# Patient Record
Sex: Female | Born: 1945 | Race: White | Hispanic: No | Marital: Married | Smoking: Never smoker
Health system: Southern US, Community
[De-identification: ages and names within clinical notes are randomized; demographics above are authoritative.]

## PROBLEM LIST (undated history)

## (undated) DIAGNOSIS — L6612 Frontal fibrosing alopecia: Secondary | ICD-10-CM

## (undated) DIAGNOSIS — Q615 Medullary cystic kidney: Secondary | ICD-10-CM

## (undated) DIAGNOSIS — L661 Lichen planopilaris: Secondary | ICD-10-CM

## (undated) HISTORY — PX: BREAST EXCISIONAL BIOPSY: SUR124

---

## 1999-02-17 ENCOUNTER — Other Ambulatory Visit: Admission: RE | Admit: 1999-02-17 | Discharge: 1999-02-17 | Payer: Self-pay | Admitting: *Deleted

## 1999-07-20 ENCOUNTER — Encounter: Payer: Self-pay | Admitting: *Deleted

## 1999-07-20 ENCOUNTER — Encounter: Admission: RE | Admit: 1999-07-20 | Discharge: 1999-07-20 | Payer: Self-pay | Admitting: *Deleted

## 2000-02-15 ENCOUNTER — Other Ambulatory Visit: Admission: RE | Admit: 2000-02-15 | Discharge: 2000-02-15 | Payer: Self-pay | Admitting: *Deleted

## 2000-03-12 ENCOUNTER — Other Ambulatory Visit: Admission: RE | Admit: 2000-03-12 | Discharge: 2000-03-12 | Payer: Self-pay | Admitting: *Deleted

## 2000-08-07 ENCOUNTER — Encounter: Admission: RE | Admit: 2000-08-07 | Discharge: 2000-08-07 | Payer: Self-pay | Admitting: *Deleted

## 2000-08-07 ENCOUNTER — Encounter: Payer: Self-pay | Admitting: *Deleted

## 2000-08-14 ENCOUNTER — Encounter: Payer: Self-pay | Admitting: *Deleted

## 2000-08-14 ENCOUNTER — Encounter: Admission: RE | Admit: 2000-08-14 | Discharge: 2000-08-14 | Payer: Self-pay | Admitting: *Deleted

## 2000-09-11 DIAGNOSIS — C4492 Squamous cell carcinoma of skin, unspecified: Secondary | ICD-10-CM

## 2000-09-11 HISTORY — DX: Squamous cell carcinoma of skin, unspecified: C44.92

## 2001-02-15 ENCOUNTER — Other Ambulatory Visit: Admission: RE | Admit: 2001-02-15 | Discharge: 2001-02-15 | Payer: Self-pay | Admitting: *Deleted

## 2001-08-07 ENCOUNTER — Other Ambulatory Visit: Admission: RE | Admit: 2001-08-07 | Discharge: 2001-08-07 | Payer: Self-pay | Admitting: *Deleted

## 2001-08-22 ENCOUNTER — Encounter: Payer: Self-pay | Admitting: *Deleted

## 2001-08-22 ENCOUNTER — Encounter: Admission: RE | Admit: 2001-08-22 | Discharge: 2001-08-22 | Payer: Self-pay | Admitting: *Deleted

## 2002-08-27 ENCOUNTER — Encounter: Payer: Self-pay | Admitting: *Deleted

## 2002-08-27 ENCOUNTER — Encounter: Admission: RE | Admit: 2002-08-27 | Discharge: 2002-08-27 | Payer: Self-pay | Admitting: *Deleted

## 2002-09-23 ENCOUNTER — Other Ambulatory Visit: Admission: RE | Admit: 2002-09-23 | Discharge: 2002-09-23 | Payer: Self-pay | Admitting: *Deleted

## 2003-09-17 ENCOUNTER — Encounter: Admission: RE | Admit: 2003-09-17 | Discharge: 2003-09-17 | Payer: Self-pay | Admitting: *Deleted

## 2003-09-30 ENCOUNTER — Other Ambulatory Visit: Admission: RE | Admit: 2003-09-30 | Discharge: 2003-09-30 | Payer: Self-pay | Admitting: *Deleted

## 2020-10-05 ENCOUNTER — Encounter: Payer: Self-pay | Admitting: Dermatology

## 2020-10-05 ENCOUNTER — Other Ambulatory Visit: Payer: Self-pay

## 2020-10-05 ENCOUNTER — Ambulatory Visit: Payer: Medicare PPO | Admitting: Dermatology

## 2020-10-05 DIAGNOSIS — L661 Lichen planopilaris: Secondary | ICD-10-CM

## 2020-10-05 DIAGNOSIS — D1800 Hemangioma unspecified site: Secondary | ICD-10-CM

## 2020-10-05 DIAGNOSIS — D489 Neoplasm of uncertain behavior, unspecified: Secondary | ICD-10-CM

## 2020-10-05 DIAGNOSIS — D485 Neoplasm of uncertain behavior of skin: Secondary | ICD-10-CM | POA: Diagnosis not present

## 2020-10-05 MED ORDER — TACROLIMUS 0.1 % EX OINT: TOPICAL_OINTMENT | CUTANEOUS | 1 refills | Status: DC

## 2020-10-05 NOTE — Progress Notes (Signed)
   New Patient Visit  Subjective  Nichole Sloan is a 75 y.o. female who presents for the following: Skin Problem (Patient presents today with a new mole on the side of her left breast, noticed 1 month ago. No symptoms. She also has an area on her left ear that gets inflamed and red every few months. She had a surgery 10+ years ago to remove part of her ear. She sleeps on her left side. 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 She uses clobetasol solution about 3 times a week. She has noticed some worsening recently.).  The following portions of the chart were reviewed this encounter and updated as appropriate:       Review of Systems:  No other skin or systemic complaints except as noted in HPI or Assessment and Plan.  Objective  Well appearing patient in no apparent distress; mood and affect are within normal limits.  A focused examination was performed including face, scalp, breast. Relevant physical exam findings are noted in the Assessment and Plan.  Objective  Left Antihelix: Pink nodule with central erosion, 1.0cm      Objective  bil temporal and frontal hairline: Thinning of the hair temporal scalp bil with alopecia and loss of follicles, receding frontal hairline with perifollicular prominence, and lonely hairs, prominent temporal veins BL, absent eyebrows  Images         Assessment & Plan   Hemangiomas - Red papules, including left breast - Discussed benign nature - Observe - Call for any changes   Neoplasm of uncertain behavior Left Antihelix  Skin / nail biopsy Type of biopsy: tangential   Informed consent: discussed and consent obtained   Patient was prepped and draped in usual sterile fashion: Area prepped with alcohol. Anesthesia: the lesion was anesthetized in a standard fashion   Anesthetic:  1% lidocaine w/ epinephrine 1-100,000 buffered w/ 8.4% NaHCO3 Instrument used: flexible  razor blade   Hemostasis achieved with: pressure, aluminum chloride and electrodesiccation   Outcome: patient tolerated procedure well   Post-procedure details: wound care instructions given   Post-procedure details comment:  Ointment and small bandage applied  Specimen 1 - Surgical pathology Differential Diagnosis: Chondrodermatitis vs Hypertrophic AK/SCC Check Margins: No Pink nodule with central erosion, 1.0cm  Recommend decrease pressure to left ear. Continue using donut pillow while sleeping.    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  No cure.  Goal is to prevent progression.  Start Protopic 0.1% ointment Apply to AAs qhs to hairline/forehead. Continue clobetasol solution qhs prn. Avoid face, groin, axilla. (pt has refills from previous dermatologist)  Discussed [place] steroid injections, Plaquenil (would require yearly eye exams).  Patient had exam in May 2021 in [place].  She will schedule baseline eye exam to start Plaquenil. Pending labs, will start Plaquenil.   tacrolimus (PROTOPIC) 0.1 % ointment - bil temporal and frontal hairline  Other Related Procedures CBC with Differential/Platelets Comprehensive metabolic panel  Return in about 2 months (around 12/03/2020) for f/u hairloss and TBSE.  IJamesetta Orleans, CMA, am acting as scribe for Brendolyn Patty, MD .  Documentation: I have reviewed the above documentation for accuracy and completeness, and I agree with the above.  Brendolyn Patty MD

## 2020-10-05 NOTE — Patient Instructions (Addendum)
Schedule baseline eye exam to start Plaquenil treatment. [facility]. Please have them send office note to Dr Nicole Kindred, fax [phone].    Wound Care Instructions  1. Cleanse wound gently with soap and water once a day then pat dry with clean gauze. Apply a thing coat of Petrolatum (petroleum jelly, "Vaseline") over the wound (unless you have an allergy to this). We recommend that you use a new, sterile tube of Vaseline. Do not pick or remove scabs. Do not remove the yellow or white "healing tissue" from the base of the wound.  2. Cover the wound with fresh, clean, nonstick gauze and secure with paper tape. You may use Band-Aids in place of gauze and tape if the would is small enough, but would recommend trimming much of the tape off as there is often too much. Sometimes Band-Aids can irritate the skin.  3. You should call the office for your biopsy report after 1 week if you have not already been contacted.  4. If you experience any problems, such as abnormal amounts of bleeding, swelling, significant bruising, significant pain, or evidence of infection, please call the office immediately.    Protopic ointment Apply to affected area of scalp at night. Continue clobetasol solution to affected areas scalp. Avoid face.

## 2020-10-07 LAB — COMPREHENSIVE METABOLIC PANEL
ALT: 17 IU/L (ref 0–32)
AST: 20 IU/L (ref 0–40)
Albumin/Globulin Ratio: 1.8 (ref 1.2–2.2)
Albumin: 4.2 g/dL (ref 3.7–4.7)
Alkaline Phosphatase: 99 IU/L (ref 44–121)
BUN/Creatinine Ratio: 23 (ref 12–28)
BUN: 19 mg/dL (ref 8–27)
Bilirubin Total: <0.2 mg/dL (ref 0.0–1.2)
CO2: 24 mmol/L (ref 20–29)
Calcium: 9.9 mg/dL (ref 8.7–10.3)
Chloride: 102 mmol/L (ref 96–106)
Creatinine, Ser: 0.83 mg/dL (ref 0.57–1.00)
GFR calc Af Amer: 81 mL/min/{1.73_m2} (ref >59)
GFR calc non Af Amer: 70 mL/min/{1.73_m2} (ref >59)
Globulin, Total: 2.3 g/dL (ref 1.5–4.5)
Glucose: 87 mg/dL (ref 65–99)
Potassium: 3.9 mmol/L (ref 3.5–5.2)
Sodium: 142 mmol/L (ref 134–144)
Total Protein: 6.5 g/dL (ref 6.0–8.5)

## 2020-10-07 LAB — CBC WITH DIFFERENTIAL/PLATELET
Basophils Absolute: 0.1 10*3/uL (ref 0.0–0.2)
Basos: 1 %
EOS (ABSOLUTE): 0.1 10*3/uL (ref 0.0–0.4)
Eos: 1 %
Hematocrit: 39.5 % (ref 34.0–46.6)
Hemoglobin: 13.1 g/dL (ref 11.1–15.9)
Immature Grans (Abs): 0 10*3/uL (ref 0.0–0.1)
Immature Granulocytes: 0 %
Lymphocytes Absolute: 2.7 10*3/uL (ref 0.7–3.1)
Lymphs: 37 %
MCH: 29.9 pg (ref 26.6–33.0)
MCHC: 33.2 g/dL (ref 31.5–35.7)
MCV: 90 fL (ref 79–97)
Monocytes Absolute: 0.5 10*3/uL (ref 0.1–0.9)
Monocytes: 6 %
Neutrophils Absolute: 4 10*3/uL (ref 1.4–7.0)
Neutrophils: 55 %
Platelets: 291 10*3/uL (ref 150–450)
RBC: 4.38 x10E6/uL (ref 3.77–5.28)
RDW: 12.5 % (ref 11.7–15.4)
WBC: 7.3 10*3/uL (ref 3.4–10.8)

## 2020-10-11 ENCOUNTER — Telehealth: Payer: Self-pay

## 2020-10-11 NOTE — Telephone Encounter (Signed)
-----   Message from Brendolyn Patty, MD sent at 10/11/2020  8:45 AM EST ----- Skin , left antihelix CHONDRODERMATITIS NODULARIS HELICIS  Benign inflammation of the cartilage likely due to pressure on ear when sleeping.  May recur.

## 2020-10-11 NOTE — Telephone Encounter (Signed)
Advised pt of lab results.  Advised pt once we get her baseline eye exam we can send in the Plaquenil 200./sh

## 2020-10-11 NOTE — Telephone Encounter (Signed)
Advised pt of bx results/sh ?

## 2020-10-11 NOTE — Telephone Encounter (Signed)
-----   Message from Brendolyn Patty, MD sent at 10/11/2020  8:47 AM EST ----- Baseline CMP and CBC normal, pending normal baseline eye exam, can start Plaquenil 200 mg PO bid, 3 rfs

## 2020-10-12 ENCOUNTER — Telehealth: Payer: Self-pay

## 2020-10-12 MED ORDER — HYDROXYCHLOROQUINE SULFATE 200 MG PO TABS: 200.0000 mg | ORAL_TABLET | Freq: Two times a day (BID) | ORAL | 3 refills | Status: DC

## 2020-10-12 NOTE — Telephone Encounter (Signed)
Advised pt we received information on pt's eye exam from [facility].  Advised Plaquenil 200mg  1 po bid #[id number] sent to [facility]./sh

## 2020-10-14 ENCOUNTER — Telehealth: Payer: Self-pay

## 2020-10-14 NOTE — Telephone Encounter (Signed)
Pt called she went to her eye doctor to discuss plaquenil  Dr Nicole Kindred prescribed her eye doctor told her due to her weight of 140 lbs she should be taking the lower dose of plaquenil, Plaquenil 200 mg bid may be to high of a dose for her weight.  please let her know what should she be taking

## 2020-10-18 NOTE — Telephone Encounter (Signed)
I would like to start her at the twice daily dosage to see if we can get a good result, and if so, we can decrease the dose later.  Plaquenil is very safe and is not a problem to be on the higher dosage for the short term.  If she stays on it chronically, then I would adjust her dose down to a level that still maintains benefit.

## 2020-10-18 NOTE — Telephone Encounter (Signed)
Patient advised of information per Dr. Nicole Kindred.

## 2020-11-22 ENCOUNTER — Ambulatory Visit: Payer: Self-pay | Admitting: Dermatology

## 2020-12-06 ENCOUNTER — Other Ambulatory Visit: Payer: Self-pay

## 2020-12-06 ENCOUNTER — Ambulatory Visit: Payer: Medicare PPO | Admitting: Dermatology

## 2020-12-06 DIAGNOSIS — D225 Melanocytic nevi of trunk: Secondary | ICD-10-CM | POA: Diagnosis not present

## 2020-12-06 DIAGNOSIS — L821 Other seborrheic keratosis: Secondary | ICD-10-CM

## 2020-12-06 DIAGNOSIS — D229 Melanocytic nevi, unspecified: Secondary | ICD-10-CM

## 2020-12-06 DIAGNOSIS — D2272 Melanocytic nevi of left lower limb, including hip: Secondary | ICD-10-CM

## 2020-12-06 DIAGNOSIS — H61002 Unspecified perichondritis of left external ear: Secondary | ICD-10-CM

## 2020-12-06 DIAGNOSIS — D2239 Melanocytic nevi of other parts of face: Secondary | ICD-10-CM

## 2020-12-06 DIAGNOSIS — L661 Lichen planopilaris: Secondary | ICD-10-CM

## 2020-12-06 DIAGNOSIS — Z1283 Encounter for screening for malignant neoplasm of skin: Secondary | ICD-10-CM

## 2020-12-06 DIAGNOSIS — L578 Other skin changes due to chronic exposure to nonionizing radiation: Secondary | ICD-10-CM

## 2020-12-06 DIAGNOSIS — D1800 Hemangioma unspecified site: Secondary | ICD-10-CM

## 2020-12-06 DIAGNOSIS — L918 Other hypertrophic disorders of the skin: Secondary | ICD-10-CM

## 2020-12-06 DIAGNOSIS — L811 Chloasma: Secondary | ICD-10-CM

## 2020-12-06 MED ORDER — HYDROQUINONE 4 % EX CREA: TOPICAL_CREAM | CUTANEOUS | 1 refills | Status: AC

## 2020-12-06 NOTE — Patient Instructions (Signed)

## 2020-12-06 NOTE — Progress Notes (Signed)
Follow-Up Visit   Subjective  Nichole Sloan is a 75 y.o. female who presents for the following: Follow-up (Patient here for TBSE and follow-up Frontal Fibrosing Alopecia. She is taking Plaquenil 200mg  BID and Protopic Ointment. She hasn't seen much improvement, but scalp is no worse. She also has dark areas on her anterior neck, not itchy or bothersome.).  She started plaquenil couple months ago after getting cleared by eye doctor.  No side effects that she has noticed.  Hair loss has stabilized.   The following portions of the chart were reviewed this encounter and updated as appropriate:       Review of Systems:  No other skin or systemic complaints except as noted in HPI or Assessment and Plan.  Objective  Well appearing patient in no apparent distress; mood and affect are within normal limits.  A full examination was performed including scalp, head, eyes, ears, nose, lips, neck, chest, axillae, abdomen, back, buttocks, bilateral upper extremities, bilateral lower extremities, hands, feet, fingers, toes, fingernails, and toenails. All findings within normal limits unless otherwise noted below.  Objective  Scalp: Thinning of the hair temporal scalp bil with alopecia and loss of follicles, receding frontal hairline with perifollicular prominence, and lonely hairs, prominent temporal veins BL, absent eyebrows  Left temporal hairline has some light hair regrowth when compared to photos  Objective  Right Spinal Lower Back: 2.27mm med dark brown macule  Left Medial Ankle: 2.74mm med dark brown macule   Left Lower Hip: 2.26mm med dark brown macule  Right Preauricular: 2.110mm 2-tone brown macule darker inferior  Images    Objective  Anterior neck: Reticulated hyperpigmentation.  Images    Objective  Left Ear: Pink biopsy site with central crust   Assessment & Plan   Skin cancer screening performed today.  Actinic Damage - chronic, secondary to cumulative UV  radiation exposure/sun exposure over time - diffuse scaly erythematous macules with underlying dyspigmentation - Recommend daily broad spectrum sunscreen SPF 30+ to sun-exposed areas, reapply every 2 hours as needed.  - Recommend staying in the shade or wearing long sleeves, sun glasses (UVA+UVB protection) and wide brim hats (4-inch brim around the entire circumference of the hat). - Call for new or changing lesions.  Acrochordons (Skin Tags) - Fleshy, skin-colored pedunculated papules - Benign appearing.  - Observe. - If desired, they can be removed with an in office procedure that is not covered by insurance. - Please call the clinic if you notice any new or changing lesions.  Seborrheic Keratoses - Stuck-on, waxy, tan-brown papules and/or plaques  - Benign-appearing - Discussed benign etiology and prognosis. - Observe - Call for any changes  Hemangiomas - Red papules - Discussed benign nature - Observe - Call for any changes   Frontal fibrosing alopecia Scalp  Frontal fibrosing alopecia. Stable, with some improvement on the left temporal hairline. Discussed chronic inflammatory condition causing scarring alopecia.  No cure.  Goal is to prevent progression.  Labs ordered today.  Continue Plaquenil 200mg  take 1 po BID. Pt has refills. Continue Protopic 0.1% ointment to hairline qhs Continue clobetasol solution to scalp behind hairline qhs  Hydroxychloroquine (plaquenil) can rarely cause irreversible damage to the retina of the eye if used for a long period of time. This damage can be identified and the medication stopped before vision changes are noticed by going for a yearly ophthalmology exam. Rarely, this medicine can cause low blood counts, liver inflammation, and/or a color change of the skin.  The  use of Plaquenil requires long term medication management, including periodic office visits and monitoring of blood work.   CBC with Differential/Platelet - Scalp  Other  Related Medications tacrolimus (PROTOPIC) 0.1 % ointment  Nevus (4) Left Lower Hip; Left Medial Ankle; Right Spinal Lower Back; Right Preauricular  Benign-appearing.  Observation.  Call clinic for new or changing moles.  Recommend daily use of broad spectrum spf 30+ sunscreen to sun-exposed areas.   Recheck right preauricular on follow-up.  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  Recommend daily broad spectrum sunscreen SPF 30+ to sun-exposed areas, including aa neck, reapply every 2 hours as needed.   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  Benign. May recur  Try to avoid sleeping on left side. Use donut pillow to avoid pressure to the area.  Return in about 3 months (around 03/08/2021) for FFA/LPP, Recheck neck, and mole on R cheek.Lindi Adie, CMA, am acting as scribe for Brendolyn Patty, MD .  Documentation: I have reviewed the above documentation for accuracy and completeness, and I agree with the above.  Brendolyn Patty MD

## 2020-12-15 ENCOUNTER — Telehealth: Payer: Self-pay

## 2020-12-15 ENCOUNTER — Other Ambulatory Visit: Payer: Self-pay

## 2020-12-15 DIAGNOSIS — L661 Lichen planopilaris: Secondary | ICD-10-CM

## 2020-12-15 LAB — CBC WITH DIFFERENTIAL/PLATELET
Basophils Absolute: 0 10*3/uL (ref 0.0–0.2)
Basos: 1 %
EOS (ABSOLUTE): 0.1 10*3/uL (ref 0.0–0.4)
Eos: 1 %
Hematocrit: 39.2 % (ref 34.0–46.6)
Hemoglobin: 13.1 g/dL (ref 11.1–15.9)
Immature Grans (Abs): 0 10*3/uL (ref 0.0–0.1)
Immature Granulocytes: 0 %
Lymphocytes Absolute: 2.2 10*3/uL (ref 0.7–3.1)
Lymphs: 37 %
MCH: 29.6 pg (ref 26.6–33.0)
MCHC: 33.4 g/dL (ref 31.5–35.7)
MCV: 89 fL (ref 79–97)
Monocytes Absolute: 0.5 10*3/uL (ref 0.1–0.9)
Monocytes: 8 %
Neutrophils Absolute: 3.2 10*3/uL (ref 1.4–7.0)
Neutrophils: 53 %
Platelets: 283 10*3/uL (ref 150–450)
RBC: 4.43 x10E6/uL (ref 3.77–5.28)
RDW: 11.9 % (ref 11.7–15.4)
WBC: 6 10*3/uL (ref 3.4–10.8)

## 2020-12-15 MED ORDER — CLOBETASOL PROPIONATE 0.05 % EX SOLN: CUTANEOUS | 3 refills | Status: DC

## 2020-12-15 NOTE — Telephone Encounter (Signed)
-----   Message from Brendolyn Patty, MD sent at 12/15/2020  8:23 AM EDT ----- CBC/diff normal.  Continue Plaquenil as prescribed

## 2020-12-15 NOTE — Progress Notes (Signed)
Pt states that she did not have the rx from different dr like she thought at appt. Rx sent to pharmacy.

## 2020-12-15 NOTE — Telephone Encounter (Signed)
Advised pt of labs and to continue plaquenil as prescribed./sh

## 2020-12-29 ENCOUNTER — Ambulatory Visit
Admission: RE | Admit: 2020-12-29 | Discharge: 2020-12-29 | Disposition: A | Discharge status: Home / Self Care | Payer: Medicare PPO | Source: Ambulatory Visit | Attending: Emergency Medicine | Admitting: Emergency Medicine

## 2020-12-29 ENCOUNTER — Other Ambulatory Visit: Payer: Self-pay

## 2020-12-29 VITALS — BP 136/74 | HR 81 | Temp 97.9°F | Resp 20

## 2020-12-29 DIAGNOSIS — R030 Elevated blood-pressure reading, without diagnosis of hypertension: Secondary | ICD-10-CM

## 2020-12-29 DIAGNOSIS — R350 Frequency of micturition: Secondary | ICD-10-CM

## 2020-12-29 HISTORY — DX: Medullary cystic kidney: Q61.5

## 2020-12-29 HISTORY — DX: Lichen planopilaris: L66.1

## 2020-12-29 HISTORY — DX: Frontal fibrosing alopecia: L66.12

## 2020-12-29 LAB — POCT URINALYSIS DIP (MANUAL ENTRY)
Bilirubin, UA: NEGATIVE
Glucose, UA: NEGATIVE mg/dL
Ketones, POC UA: NEGATIVE mg/dL
Leukocytes, UA: NEGATIVE
Nitrite, UA: NEGATIVE
Protein Ur, POC: NEGATIVE mg/dL
Spec Grav, UA: 1.02 (ref 1.010–1.025)
Urobilinogen, UA: 0.2 E.U./dL
pH, UA: 6 (ref 5.0–8.0)

## 2020-12-29 NOTE — Discharge Instructions (Addendum)
Your urine does not show signs of infection today.  Follow up with your primary care provider if your symptoms are not improving.    Your blood pressure is elevated today at 169/75.  Please have this rechecked by your primary care provider in 2-4 weeks.

## 2020-12-29 NOTE — ED Provider Notes (Signed)
[facility]    CSN: [id number] Arrival date & time: 12/29/20  1039      History   Chief Complaint Chief Complaint  Patient presents with  . Dysuria    HPI Nichole Sloan is a 75 y.o. female.   Patient presents with 1 week history of urinary frequency and bladder "pressure" after urinating.  She denies dysuria, hematuria, fever, chills, abdominal pain, back pain, pelvic pain, vaginal discharge, or other symptoms.  No treatments attempted at home.  She reports history of medullary sponge kidney and was followed by a kidney specialist when she lived in [place]; She has not established a kidney specialist here yet.   The history is provided by the patient and medical records.    Past Medical History:  Diagnosis Date  . Frontal fibrosing alopecia   . Medullary sponge kidney   . Squamous cell carcinoma of skin 2002   L upper forehead at hairline, tx in [place]    There are no problems to display for this patient.   History reviewed. No pertinent surgical history.  OB History   No obstetric history on file.      Home Medications    Prior to Admission medications   Medication Sig Start Date End Date Taking? Authorizing Provider  clobetasol (TEMOVATE) 0.05 % external solution Apply topically to scalp behind hairline every night 12/15/20   Brendolyn Patty, MD  hydroquinone 4 % cream Apply to affected area neck twice daily until improved. 12/06/20   Brendolyn Patty, MD  hydroxychloroquine (PLAQUENIL) 200 MG tablet Take 1 tablet (200 mg total) by mouth 2 (two) times daily. 10/12/20   Brendolyn Patty, MD  tacrolimus (PROTOPIC) 0.1 % ointment Apply to affected areas frontal scalp every night as directd. 10/05/20   Brendolyn Patty, MD    Family History Family History  Family history unknown: Yes    Social History Social History   Tobacco Use  . Smoking status: Never Smoker  . Smokeless tobacco: Never Used  Substance Use Topics  . Drug use: Never     Allergies    Penicillins   Review of Systems Review of Systems  Constitutional: Negative for chills and fever.  HENT: Negative for ear pain and sore throat.   Eyes: Negative for pain and visual disturbance.  Respiratory: Negative for cough and shortness of breath.   Cardiovascular: Negative for chest pain and palpitations.  Gastrointestinal: Negative for abdominal pain and vomiting.  Genitourinary: Positive for frequency. Negative for dysuria, flank pain, hematuria, pelvic pain and vaginal discharge.  Musculoskeletal: Negative for arthralgias and back pain.  Skin: Negative for color change and rash.  Neurological: Negative for seizures and syncope.  All other systems reviewed and are negative.    Physical Exam Triage Vital Signs ED Triage Vitals  Enc Vitals Group     BP      Pulse      Resp      Temp      Temp src      SpO2      Weight      Height      Head Circumference      Peak Flow      Pain Score      Pain Loc      Pain Edu?      Excl. in [place]?    No data found.  Updated Vital Signs BP 136/74   Pulse 81   Temp 97.9 F (36.6 C)   Resp 20  SpO2 98%   Visual Acuity Right Eye Distance:   Left Eye Distance:   Bilateral Distance:    Right Eye Near:   Left Eye Near:    Bilateral Near:     Physical Exam Vitals and nursing note reviewed.  Constitutional:      General: She is not in acute distress.    Appearance: She is well-developed. She is not ill-appearing.  HENT:     Head: Normocephalic and atraumatic.     Mouth/Throat:     Mouth: Mucous membranes are moist.  Eyes:     Conjunctiva/sclera: Conjunctivae normal.  Cardiovascular:     Rate and Rhythm: Normal rate and regular rhythm.     Heart sounds: Normal heart sounds.  Pulmonary:     Effort: Pulmonary effort is normal. No respiratory distress.     Breath sounds: Normal breath sounds.  Abdominal:     Palpations: Abdomen is soft.     Tenderness: There is no abdominal tenderness. There is no right CVA  tenderness, left CVA tenderness, guarding or rebound.  Musculoskeletal:     Cervical back: Neck supple.  Skin:    General: Skin is warm and dry.  Neurological:     General: No focal deficit present.     Mental Status: She is alert and oriented to person, place, and time.     Gait: Gait normal.  Psychiatric:        Mood and Affect: Mood normal.        Behavior: Behavior normal.      UC Treatments / Results  Labs (all labs ordered are listed, but only abnormal results are displayed) Labs Reviewed  POCT URINALYSIS DIP (MANUAL ENTRY) - Abnormal; Notable for the following components:      Result Value   Blood, UA small (*)    All other components within normal limits    EKG   Radiology No results found.  Procedures Procedures (including critical care time)  Medications Ordered in UC Medications - No data to display  Initial Impression / Assessment and Plan / UC Course  I have reviewed the triage vital signs and the nursing notes.  Pertinent labs & imaging results that were available during my care of the patient were reviewed by me and considered in my medical decision making (see chart for details).   Urinary frequency.  Elevated blood pressure reading.  Urine does not show signs of infection.  Instructed patient to follow-up with her PCP if her symptoms are not improving.  Also discussed that her blood pressure is elevated today and needs to be rechecked by her PCP in 2 to 4 weeks.  She agrees to plan of care.   Final Clinical Impressions(s) / UC Diagnoses   Final diagnoses:  Urinary frequency  Elevated blood pressure reading     Discharge Instructions     Your urine does not show signs of infection today.  Follow up with your primary care provider if your symptoms are not improving.    Your blood pressure is elevated today at 169/75.  Please have this rechecked by your primary care provider in 2-4 weeks.         ED Prescriptions    None     PDMP not  reviewed this encounter.   Sharion Balloon, NP 12/29/20 1116

## 2020-12-29 NOTE — ED Triage Notes (Signed)
Pt presents with c/o urinary frequency and pressure for past week

## 2021-01-31 ENCOUNTER — Ambulatory Visit: Payer: Self-pay | Admitting: Dermatology

## 2021-02-07 ENCOUNTER — Other Ambulatory Visit: Payer: Self-pay

## 2021-02-07 ENCOUNTER — Emergency Department: Payer: Medicare PPO

## 2021-02-07 ENCOUNTER — Emergency Department
Admission: EM | Admit: 2021-02-07 | Discharge: 2021-02-07 | Disposition: A | Discharge status: Home / Self Care | Payer: Medicare PPO | Attending: Emergency Medicine | Admitting: Emergency Medicine

## 2021-02-07 DIAGNOSIS — R109 Unspecified abdominal pain: Secondary | ICD-10-CM | POA: Diagnosis not present

## 2021-02-07 DIAGNOSIS — R300 Dysuria: Secondary | ICD-10-CM

## 2021-02-07 DIAGNOSIS — R10A0 Flank pain, unspecified side: Secondary | ICD-10-CM

## 2021-02-07 LAB — URINALYSIS, COMPLETE (UACMP) WITH MICROSCOPIC
Bacteria, UA: NONE SEEN
Bilirubin Urine: NEGATIVE
Glucose, UA: NEGATIVE mg/dL
Hgb urine dipstick: NEGATIVE
Ketones, ur: NEGATIVE mg/dL
Leukocytes,Ua: NEGATIVE
Nitrite: NEGATIVE
Protein, ur: NEGATIVE mg/dL
Specific Gravity, Urine: 1.014 (ref 1.005–1.030)
pH: 6 (ref 5.0–8.0)

## 2021-02-07 MED ORDER — PREDNISONE 50 MG PO TABS: 50.0000 mg | ORAL_TABLET | Freq: Every day | ORAL | 0 refills | Status: DC

## 2021-02-07 MED ORDER — METHOCARBAMOL 500 MG PO TABS: 500.0000 mg | ORAL_TABLET | Freq: Four times a day (QID) | ORAL | 0 refills | Status: DC

## 2021-02-07 NOTE — ED Triage Notes (Signed)
Pt comes with c/o right sided flank pain. Pt states this weekend she has pain when going to bathroom. Pt states that resolved and she feels she might have passed kidney stone.  Pt states last night the pain started again.

## 2021-02-07 NOTE — ED Provider Notes (Signed)
[facility] Emergency Department Provider Note  ____________________________________________  Time seen: Approximately 3:20 PM  I have reviewed the triage vital signs and the nursing notes.   HISTORY  Chief Complaint Flank Pain    HPI Nichole Sloan is a 75 y.o. female who presents the emergency department complaining of right flank pain.  Patient states that she had had some UTI-like symptoms, with some dysuria and had seen her primary care.  She has been placed on antibiotics but had to negative cultures.  Patient thought that the symptoms had improved and then she started to develop some right flank pain.  She states that she does have a history of kidney stones and did end up having a stent placed due to the size of her last kidney stone.  Last episode of kidney stones was 4 years ago.  She is had no hematuria.  No fevers or chills.  No abdominal pain.         Past Medical History:  Diagnosis Date  . Frontal fibrosing alopecia   . Medullary sponge kidney   . Squamous cell carcinoma of skin 2002   L upper forehead at hairline, tx in [place]    There are no problems to display for this patient.   History reviewed. No pertinent surgical history.  Prior to Admission medications   Medication Sig Start Date End Date Taking? Authorizing Provider  methocarbamol (ROBAXIN) 500 MG tablet Take 1 tablet (500 mg total) by mouth 4 (four) times daily. 02/07/21  Yes Rosealee Recinos, Charline Bills, PA-C  predniSONE (DELTASONE) 50 MG tablet Take 1 tablet (50 mg total) by mouth daily with breakfast. 02/07/21  Yes Steven Basso, Charline Bills, PA-C  clobetasol (TEMOVATE) 0.05 % external solution Apply topically to scalp behind hairline every night 12/15/20   Brendolyn Patty, MD  hydroquinone 4 % cream Apply to affected area neck twice daily until improved. 12/06/20   Brendolyn Patty, MD  hydroxychloroquine (PLAQUENIL) 200 MG tablet Take 1 tablet (200 mg total) by mouth 2 (two) times daily.  10/12/20   Brendolyn Patty, MD  tacrolimus (PROTOPIC) 0.1 % ointment Apply to affected areas frontal scalp every night as directd. 10/05/20   Brendolyn Patty, MD    Allergies Penicillins  Family History  Family history unknown: Yes    Social History Social History   Tobacco Use  . Smoking status: Never Smoker  . Smokeless tobacco: Never Used  Substance Use Topics  . Drug use: Never     Review of Systems  Constitutional: No fever/chills Eyes: No visual changes. No discharge ENT: No upper respiratory complaints. Cardiovascular: no chest pain. Respiratory: no cough. No SOB. Gastrointestinal: No abdominal pain.  No nausea, no vomiting.  No diarrhea.  No constipation. Genitourinary: Right flank pain.  Patient has had dysuria but currently resolved.. No hematuria Musculoskeletal: Negative for musculoskeletal pain. Skin: Negative for rash, abrasions, lacerations, ecchymosis. Neurological: Negative for headaches, focal weakness or numbness.  10 System ROS otherwise negative.  ____________________________________________   PHYSICAL EXAM:  VITAL SIGNS: ED Triage Vitals  Enc Vitals Group     BP 02/07/21 1350 (!) 160/64     Pulse Rate 02/07/21 1350 79     Resp 02/07/21 1350 16     Temp 02/07/21 1350 98 F (36.7 C)     Temp Source 02/07/21 1350 Oral     SpO2 02/07/21 1350 98 %     Weight --      Height --      Head Circumference --  Peak Flow --      Pain Score 02/07/21 1348 8     Pain Loc --      Pain Edu? --      Excl. in [place]? --      Constitutional: Alert and oriented. Well appearing and in no acute distress. Eyes: Conjunctivae are normal. PERRL. EOMI. Head: Atraumatic. ENT:      Ears:       Nose: No congestion/rhinnorhea.      Mouth/Throat: Mucous membranes are moist.  Neck: No stridor.    Cardiovascular: Normal rate, regular rhythm. Normal S1 and S2.  Good peripheral circulation. Respiratory: Normal respiratory effort without tachypnea or retractions. Lungs  CTAB. Good air entry to the bases with no decreased or absent breath sounds. Gastrointestinal: Bowel sounds 4 quadrants. Soft and nontender to palpation. No guarding or rigidity. No palpable masses. No distention.  Minimal right-sided CVA tenderness. Musculoskeletal: Full range of motion to all extremities. No gross deformities appreciated. Neurologic:  Normal speech and language. No gross focal neurologic deficits are appreciated.  Skin:  Skin is warm, dry and intact. No rash noted. Psychiatric: Mood and affect are normal. Speech and behavior are normal. Patient exhibits appropriate insight and judgement.   ____________________________________________   LABS (all labs ordered are listed, but only abnormal results are displayed)  Labs Reviewed  URINALYSIS, COMPLETE (UACMP) WITH MICROSCOPIC - Abnormal; Notable for the following components:      Result Value   Color, Urine YELLOW (*)    APPearance CLEAR (*)    All other components within normal limits  URINE CULTURE   ____________________________________________  EKG   ____________________________________________  RADIOLOGY I personally viewed and evaluated these images as part of my medical decision making, as well as reviewing the written report by the radiologist.  ED Provider Interpretation: No evidence of nephrolithiasis, hydronephrosis, perinephric stranding.  Spondylosis of the spine  CT Renal Stone Study  Result Date: 02/07/2021 CLINICAL DATA:  Right-sided flank pain. EXAM: CT ABDOMEN AND PELVIS WITHOUT CONTRAST TECHNIQUE: Multidetector CT imaging of the abdomen and pelvis was performed following the standard protocol without IV contrast. COMPARISON:  None. FINDINGS: Lower chest: No acute abnormality. Hepatobiliary: No focal liver abnormality is seen. No gallstones, gallbladder wall thickening, or biliary dilatation. Pancreas: Unremarkable. No pancreatic ductal dilatation or surrounding inflammatory changes. Spleen: Normal  in size without focal abnormality. Adrenals/Urinary Tract: Adrenal glands are unremarkable. Kidneys are normal, without renal calculi, focal lesion, or hydronephrosis. Bladder is unremarkable. Stomach/Bowel: Stomach is within normal limits. No evidence of appendicitis. No evidence of bowel wall thickening, distention, or inflammatory changes. Vascular/Lymphatic: No significant vascular findings are present. No enlarged abdominal or pelvic lymph nodes. Reproductive: Leiomyomatous uterus.  No adnexal masses seen. Other: No abdominal wall hernia or abnormality. No abdominopelvic ascites. Musculoskeletal: Spondylosis of the spine. IMPRESSION: 1. No evidence of nephrolithiasis or obstructive uropathy. 2. Leiomyomatous uterus. 3. Spondylosis of the spine. Electronically Signed   By: Fidela Salisbury M.D.   On: 02/07/2021 15:44    ____________________________________________    PROCEDURES  Procedure(s) performed:    Procedures    Medications - No data to display   ____________________________________________   INITIAL IMPRESSION / ASSESSMENT AND PLAN / ED COURSE  Pertinent labs & imaging results that were available during my care of the patient were reviewed by me and considered in my medical decision making (see chart for details).  Review of the [place] CSRS was performed in accordance of the [place] prior to dispensing any controlled drugs.  Patient's diagnosis is consistent with dysuria and flank pain.  Patient presented to the emergency department complaining of some dysuria and right-sided flank pain.  Patient had completed a course of antibiotics for dysuria but has had negative urinalysis and negative urine cultures x2 from her primary care.  Patient was having some symptoms and came in for evaluation.  She did have some right-sided paraspinal muscle group and CVA tenderness.  No other acute findings.  Patient was neurologically intact.  Urinalysis is reassuring.  CT scan  reveals no acute findings.  At this time I will treat for symptoms with steroid and muscle relaxer to cover both a musculoskeletal source as well as a possible interstitial cystitis.  Urine is cultured again though I have a very low suspicion for UTI at this time.  Given no evidence of nephrolithiasis, hydronephrosis or pyelonephritis on imaging.  Return precautions discussed with the patient.  Otherwise follow-up primary care.  Patient is given ED precautions to return to the ED for any worsening or new symptoms.     ____________________________________________  FINAL CLINICAL IMPRESSION(S) / ED DIAGNOSES  Final diagnoses:  Flank pain  Dysuria      NEW MEDICATIONS STARTED DURING THIS VISIT:  ED Discharge Orders         Ordered    predniSONE (DELTASONE) 50 MG tablet  Daily with breakfast        02/07/21 1751    methocarbamol (ROBAXIN) 500 MG tablet  4 times daily        02/07/21 1751              This chart was dictated using voice recognition software/Dragon. Despite best efforts to proofread, errors can occur which can change the meaning. Any change was purely unintentional.    Darletta Moll, PA-C 02/07/21 1753    Blake Divine, MD 02/10/21 [phone]

## 2021-02-09 LAB — URINE CULTURE: Culture: NO GROWTH

## 2021-02-11 ENCOUNTER — Other Ambulatory Visit: Payer: Self-pay | Admitting: Dermatology

## 2021-03-15 ENCOUNTER — Other Ambulatory Visit: Payer: Self-pay

## 2021-03-15 ENCOUNTER — Ambulatory Visit: Payer: Medicare PPO | Admitting: Dermatology

## 2021-03-15 DIAGNOSIS — D489 Neoplasm of uncertain behavior, unspecified: Secondary | ICD-10-CM

## 2021-03-15 DIAGNOSIS — L811 Chloasma: Secondary | ICD-10-CM

## 2021-03-15 DIAGNOSIS — L821 Other seborrheic keratosis: Secondary | ICD-10-CM

## 2021-03-15 DIAGNOSIS — D2239 Melanocytic nevi of other parts of face: Secondary | ICD-10-CM

## 2021-03-15 DIAGNOSIS — Z86018 Personal history of other benign neoplasm: Secondary | ICD-10-CM

## 2021-03-15 DIAGNOSIS — L661 Lichen planopilaris: Secondary | ICD-10-CM | POA: Diagnosis not present

## 2021-03-15 HISTORY — DX: Personal history of other benign neoplasm: Z86.018

## 2021-03-15 MED ORDER — HYDROXYCHLOROQUINE SULFATE 200 MG PO TABS: 200.0000 mg | ORAL_TABLET | Freq: Two times a day (BID) | ORAL | 3 refills | Status: DC

## 2021-03-15 MED ORDER — FINASTERIDE 5 MG PO TABS: 5.0000 mg | ORAL_TABLET | Freq: Every day | ORAL | 2 refills | Status: DC

## 2021-03-15 NOTE — Progress Notes (Signed)
Follow-Up Visit   Subjective  Nichole Sloan is a 75 y.o. female who presents for the following: Frontal Fibrosing Alopecia (Scalp, 3 month follow-up. Stable. Plaquenil 200mg  BID, Clobetasol solution qhs, Tacrolimus 0.1% ointment qhs.) and Melasma (Vs Actinic Damage. Anterior neck. Patient has tried 4% hydroquinone cream. No improvement. ). Recheck mole on face  The following portions of the chart were reviewed this encounter and updated as appropriate:        Review of Systems:  No other skin or systemic complaints except as noted in HPI or Assessment and Plan.  Objective  Well appearing patient in no apparent distress; mood and affect are within normal limits.  A focused examination was performed including scalp, neck. Relevant physical exam findings are noted in the Assessment and Plan.  Scalp Thinning of the hair temporal scalp bil with alopecia and loss of follicles, receding frontal hairline with perifollicular prominence, and lonely hairs, prominent temporal veins BL, absent eyebrows   Left temporal hairline has some light hair regrowth when compared to photos  Anterior Neck Reticulated hyperpigmentation  Right Preauricular 2.35mm 2-tone brown macule darker inferior  Photo taken previous visit.       Assessment & Plan   Seborrheic Keratoses - Stuck-on, waxy, tan-brown papules and/or plaques  - Benign-appearing - Discussed benign etiology and prognosis. - Observe - Call for any changes   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  No cure.  Goal is to prevent progression.  Photos compared from 10/05/20 visit.  Continue Plaquenil 200mg  take 1 po BID dsp #60 3Rf. Continue Protopic 0.1% ointment to hairline qhs. Continue clobetasol solution to scalp behind hairline qhs. Start finasteride 5mg  take 1/2 tablet po QD (Rx written 1 po QD dsp #30 2Rf) Discussed Rogaine 5% Apply qhs to  scalp.  Labs reviewed and wnl from 12/14/20 and 01/13/21.  Labs on follow-up. Patient will continue yearly eye exams while on Plaquenil.  Hydroxychloroquine (plaquenil) can rarely cause irreversible damage to the retina of the eye if used for a long period of time. This damage can be identified and the medication stopped before vision changes are noticed by going for a yearly ophthalmology exam. Rarely, this medicine can cause low blood counts, liver inflammation, and/or a color change of the skin.  The use of Plaquenil requires long term medication management, including periodic office visits and monitoring of blood work.    finasteride (PROSCAR) 5 MG tablet - Scalp Take 1 tablet (5 mg total) by mouth daily.  Related Medications tacrolimus (PROTOPIC) 0.1 % ointment Apply to affected areas frontal scalp every night as directd.  clobetasol (TEMOVATE) 0.05 % external solution Apply topically to scalp behind hairline every night  Melasma Anterior Neck  No change when compared with baseline photo  Melasma is a chronic condition of persistent pigmented patches generally on the face, worse in summer due to higher UV exposure.  Oral estrogen containing BCPs or supplements can exacerbate condition.  Recommend daily broad spectrum tinted sunscreen SPF 30+ to face, preferably with Zinc or Titanium Dioxide. Discussed Rx topical bleaching creams (i.e. hydroquinone), OTC HelioCare supplement, chemical peels (would need multiple for best result).   Start Hydroquinone: 12%, Kojic Acid: 6%, Niacinamide: 2%, Vitamin C: 1% Cream Apply BID for up to 12 weeks dsp 30g 1Rf. ([facility])  Related Medications hydroquinone 4 % cream Apply to affected area neck twice daily until improved.  Neoplasm of uncertain behavior Right Preauricular  Epidermal / dermal  shaving  Lesion diameter (cm):  0.4 Informed consent: discussed and consent obtained   Patient was prepped and draped in usual sterile fashion:  Area prepped with alcohol. Anesthesia: the lesion was anesthetized in a standard fashion   Anesthetic:  0.5% bupivicaine w/ epinephrine 1-100,000 local infiltration Instrument used: flexible razor blade   Hemostasis achieved with: pressure, aluminum chloride and electrodesiccation   Outcome: patient tolerated procedure well   Post-procedure details: wound care instructions given   Post-procedure details comment:  Ointment and small bandage applied  Specimen 1 - Surgical pathology Differential Diagnosis: Nevus vs SK r/o Atypia Check Margins: Yes 2.72mm 2-tone brown macule darker inferior     Return in about 3 months (around 06/15/2021) for Alopecia, Melasma.  IJamesetta Orleans, CMA, am acting as scribe for Brendolyn Patty, MD . Documentation: I have reviewed the above documentation for accuracy and completeness, and I agree with the above.  Brendolyn Patty MD

## 2021-03-15 NOTE — Patient Instructions (Addendum)
Finasteride - Take 1/2 tablet by mouth once daily. (Prescription written to take 1 tablet daily)   Instructions for [facility] Medications  One or more of your medications was sent to the [facility] mail order compounding pharmacy. You will receive an email from them and can purchase the medicine through that link. It will then be mailed to your home at the address you confirmed. If for any reason you do not receive an email from them, please check your spam folder. If you still do not find the email, please let us know. [facility] phone number is [phone].    Wound Care Instructions  Cleanse wound gently with soap and water once a day then pat dry with clean gauze. Apply a thing coat of Petrolatum (petroleum jelly, "Vaseline") over the wound (unless you have an allergy to this). We recommend that you use a new, sterile tube of Vaseline. Do not pick or remove scabs. Do not remove the yellow or white "healing tissue" from the base of the wound.  Cover the wound with fresh, clean, nonstick gauze and secure with paper tape. You may use Band-Aids in place of gauze and tape if the would is small enough, but would recommend trimming much of the tape off as there is often too much. Sometimes Band-Aids can irritate the skin.  You should call the office for your biopsy report after 1 week if you have not already been contacted.  If you experience any problems, such as abnormal amounts of bleeding, swelling, significant bruising, significant pain, or evidence of infection, please call the office immediately.  FOR ADULT SURGERY PATIENTS: If you need something for pain relief you may take 1 extra strength Tylenol (acetaminophen) AND 2 Ibuprofen (200mg  each) together every 4 hours as needed for pain. (do not take these if you are allergic to them or if you have a reason you should not take them.) Typically, you may only need pain medication for 1 to 3 days.     Melasma is a chronic  condition of persistent pigmented patches generally on the face, worse in summer due to higher UV exposure.  Oral estrogen containing BCPs or supplements can exacerbate condition.  Recommend daily broad spectrum tinted sunscreen SPF 30+ to face, preferably with Zinc or Titanium Dioxide. Discussed Rx topical bleaching creams (i.e. hydroquinone), OTC HelioCare supplement, chemical peels (would need multiple for best result).     If you have any questions or concerns for your doctor, please call our main line at [phone] and press option 4 to reach your doctor's medical assistant. If no one answers, please leave a voicemail as directed and we will return your call as soon as possible. Messages left after 4 pm will be answered the following business day.   You may also send [place] a message via [place]. We typically respond to MyChart messages within 1-2 business days.  For prescription refills, please ask your pharmacy to contact our office. Our fax number is [phone].  If you have an urgent issue when the clinic is closed that cannot wait until the next business day, you can page your doctor at the number below.    Please note that while we do our best to be available for urgent issues outside of office hours, we are not available 24/7.   If you have an urgent issue and are unable to reach [place], you may choose to seek medical care at your doctor's office, retail clinic, urgent care center, or emergency room.  If you have a medical emergency, please immediately call 911 or go to the emergency department.  Pager Numbers  - Dr. Nehemiah Massed: [phone]  - Dr. Laurence Ferrari: [phone]  - Dr. Nicole Kindred: [phone]  In the event of inclement weather, please call our main line at [phone] for an update on the status of any delays or closures.  Dermatology Medication Tips: Please keep the boxes that topical medications come in in order to help keep track of the instructions about where and how to  use these. Pharmacies typically print the medication instructions only on the boxes and not directly on the medication tubes.   If your medication is too expensive, please contact our office at [phone] option 4 or send [place] a message through [place].   We are unable to tell what your co-pay for medications will be in advance as this is different depending on your insurance coverage. However, we may be able to find a substitute medication at lower cost or fill out paperwork to get insurance to cover a needed medication.   If a prior authorization is required to get your medication covered by your insurance company, please allow [place] 1-2 business days to complete this process.  Drug prices often vary depending on where the prescription is filled and some pharmacies may offer cheaper prices.  The website www.goodrx.com contains coupons for medications through different pharmacies. The prices here do not account for what the cost may be with help from insurance (it may be cheaper with your insurance), but the website can give you the price if you did not use any insurance.  - You can print the associated coupon and take it with your prescription to the pharmacy.  - You may also stop by our office during regular business hours and pick up a GoodRx coupon card.  - If you need your prescription sent electronically to a different pharmacy, notify our office through [facility] or by phone at [phone] option 4.

## 2021-03-21 ENCOUNTER — Telehealth: Payer: Self-pay

## 2021-03-21 NOTE — Telephone Encounter (Signed)
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

## 2021-05-27 ENCOUNTER — Other Ambulatory Visit: Payer: Self-pay | Admitting: Family Medicine

## 2021-05-27 DIAGNOSIS — Z1231 Encounter for screening mammogram for malignant neoplasm of breast: Secondary | ICD-10-CM

## 2021-06-13 ENCOUNTER — Ambulatory Visit: Payer: Medicare PPO | Admitting: Dermatology

## 2021-06-14 ENCOUNTER — Encounter: Payer: Self-pay | Admitting: Dermatology

## 2021-06-23 ENCOUNTER — Other Ambulatory Visit: Payer: Self-pay

## 2021-06-23 ENCOUNTER — Ambulatory Visit
Admission: RE | Admit: 2021-06-23 | Discharge: 2021-06-23 | Disposition: A | Discharge status: Home / Self Care | Payer: Medicare PPO | Source: Ambulatory Visit | Attending: Family Medicine | Admitting: Family Medicine

## 2021-06-23 DIAGNOSIS — Z1231 Encounter for screening mammogram for malignant neoplasm of breast: Secondary | ICD-10-CM | POA: Diagnosis present

## 2021-06-28 ENCOUNTER — Inpatient Hospital Stay
Admission: RE | Admit: 2021-06-28 | Discharge: 2021-06-28 | Disposition: A | Discharge status: Home / Self Care | Payer: Self-pay | Source: Ambulatory Visit | Attending: *Deleted | Admitting: *Deleted

## 2021-06-28 ENCOUNTER — Other Ambulatory Visit: Payer: Self-pay | Admitting: *Deleted

## 2021-06-28 DIAGNOSIS — Z1231 Encounter for screening mammogram for malignant neoplasm of breast: Secondary | ICD-10-CM

## 2021-07-29 ENCOUNTER — Other Ambulatory Visit: Payer: Self-pay | Admitting: Dermatology

## 2021-09-21 ENCOUNTER — Other Ambulatory Visit: Payer: Self-pay | Admitting: Dermatology

## 2021-10-12 ENCOUNTER — Ambulatory Visit: Payer: Medicare PPO | Admitting: Dermatology

## 2021-10-12 ENCOUNTER — Other Ambulatory Visit: Payer: Self-pay

## 2021-10-12 ENCOUNTER — Encounter: Payer: Self-pay | Admitting: Dermatology

## 2021-10-12 DIAGNOSIS — Z86018 Personal history of other benign neoplasm: Secondary | ICD-10-CM

## 2021-10-12 DIAGNOSIS — D1800 Hemangioma unspecified site: Secondary | ICD-10-CM

## 2021-10-12 DIAGNOSIS — L661 Lichen planopilaris: Secondary | ICD-10-CM

## 2021-10-12 DIAGNOSIS — L811 Chloasma: Secondary | ICD-10-CM

## 2021-10-12 DIAGNOSIS — L573 Poikiloderma of Civatte: Secondary | ICD-10-CM

## 2021-10-12 DIAGNOSIS — Z79899 Other long term (current) drug therapy: Secondary | ICD-10-CM | POA: Diagnosis not present

## 2021-10-12 DIAGNOSIS — L820 Inflamed seborrheic keratosis: Secondary | ICD-10-CM | POA: Diagnosis not present

## 2021-10-12 NOTE — Patient Instructions (Addendum)
Cryotherapy Aftercare  Wash gently with soap and water everyday.   Apply Vaseline and Band-Aid daily until healed.   Prior to procedure, discussed risks of blister formation, small wound, skin dyspigmentation, or rare scar following cryotherapy. Recommend Vaseline ointment to treated areas while healing.   Continue Tacrolimus ointment at bedtime to frontal hair line at scalp.  Continue Clobetasol solution at bedtime on weekdays to frontal scalp behind hair line. Avoid applying to face, groin, and axilla. Use as directed. Long-term use can cause thinning of the skin.   Continue Plaquenil 200mg  twice daily.   Resume Finasteride 5mg  1/2 tablet once daily.   Topical steroids (such as triamcinolone, fluocinolone, fluocinonide, mometasone, clobetasol, halobetasol, betamethasone, hydrocortisone) can cause thinning and lightening of the skin if they are used for too long in the same area. Your physician has selected the right strength medicine for your problem and area affected on the body. Please use your medication only as directed by your physician to prevent side effects.    If You Need Anything After Your Visit  If you have any questions or concerns for your doctor, please call our main line at [phone] and press option 4 to reach your doctor's medical assistant. If no one answers, please leave a voicemail as directed and we will return your call as soon as possible. Messages left after 4 pm will be answered the following business day.   You may also send [place] a message via [place]. We typically respond to MyChart messages within 1-2 business days.  For prescription refills, please ask your pharmacy to contact our office. Our fax number is [phone].  If you have an urgent issue when the clinic is closed that cannot wait until the next business day, you can page your doctor at the number below.    Please note that while we do our best to be available for urgent issues outside of office  hours, we are not available 24/7.   If you have an urgent issue and are unable to reach [place], you may choose to seek medical care at your doctor's office, retail clinic, urgent care center, or emergency room.  If you have a medical emergency, please immediately call 911 or go to the emergency department.  Pager Numbers  - Dr. Nehemiah Massed: [phone]  - Dr. Laurence Ferrari: [phone]  - Dr. Nicole Kindred: [phone]  In the event of inclement weather, please call our main line at [phone] for an update on the status of any delays or closures.  Dermatology Medication Tips: Please keep the boxes that topical medications come in in order to help keep track of the instructions about where and how to use these. Pharmacies typically print the medication instructions only on the boxes and not directly on the medication tubes.   If your medication is too expensive, please contact our office at [phone] option 4 or send [place] a message through [place].   We are unable to tell what your co-pay for medications will be in advance as this is different depending on your insurance coverage. However, we may be able to find a substitute medication at lower cost or fill out paperwork to get insurance to cover a needed medication.   If a prior authorization is required to get your medication covered by your insurance company, please allow [place] 1-2 business days to complete this process.  Drug prices often vary depending on where the prescription is filled and some pharmacies may offer cheaper prices.  The website www.goodrx.com contains coupons for  medications through different pharmacies. The prices here do not account for what the cost may be with help from insurance (it may be cheaper with your insurance), but the website can give you the price if you did not use any insurance.  - You can print the associated coupon and take it with your prescription to the pharmacy.  - You may also stop by our office during regular  business hours and pick up a GoodRx coupon card.  - If you need your prescription sent electronically to a different pharmacy, notify our office through [facility] or by phone at [phone] option 4.     Si Usted Necesita Algo Despus de Su Visita  Tambin puede enviarnos un mensaje a travs de Pharmacist, community. Por lo general respondemos a los mensajes de MyChart en el transcurso de 1 a 2 das hbiles.  Para renovar recetas, por favor pida a su farmacia que se ponga en contacto con nuestra oficina. Harland Dingwall de fax es [place] [phone].  Si tiene un asunto urgente cuando la clnica est cerrada y que no puede esperar hasta el siguiente da hbil, puede llamar/localizar a su doctor(a) al nmero que aparece a continuacin.   Por favor, tenga en cuenta que aunque hacemos todo lo posible para estar disponibles para asuntos urgentes fuera del horario de [place], no estamos disponibles las 24 horas del da, los 7 das de la [place].   Si tiene un problema urgente y no puede comunicarse con nosotros, puede optar por buscar atencin mdica  en el consultorio de su doctor(a), en una clnica privada, en un centro de atencin urgente o en una sala de emergencias.  Si tiene Engineering geologist, por favor llame inmediatamente al 911 o vaya a la sala de emergencias.  Nmeros de bper  - Dr. Nehemiah Massed: [phone]  - Dra. Moye: [phone]  - Dra. Nicole Kindred: [phone]  En caso de inclemencias del [place], por favor llame a Johnsie Kindred principal al [phone] para una actualizacin sobre el [place] de cualquier retraso o cierre.  Consejos para la medicacin en dermatologa: Por favor, guarde las cajas en las que vienen los medicamentos de uso tpico para ayudarle a seguir las instrucciones sobre dnde y cmo usarlos. Las farmacias generalmente imprimen las instrucciones del medicamento slo en las cajas y no directamente en los tubos del [place].   Si su medicamento es muy caro, por  favor, pngase en contacto con Zigmund Daniel llamando al [phone] y presione la opcin 4 o envenos un mensaje a travs de Pharmacist, community.   No podemos decirle cul ser su copago por los medicamentos por adelantado ya que esto es diferente dependiendo de la cobertura de su seguro. Sin embargo, es posible que podamos encontrar un medicamento sustituto a Electrical engineer un formulario para que el seguro cubra el medicamento que se considera necesario.   Si se requiere una autorizacin previa para que su compaa de seguros [place] su medicamento, por favor permtanos de 1 a 2 das hbiles para completar este proceso.  Los precios de los medicamentos varan con frecuencia dependiendo del Environmental consultant de dnde se surte la receta y alguna farmacias pueden ofrecer precios ms baratos.  El sitio web www.goodrx.com tiene cupones para medicamentos de Airline pilot. Los precios aqu no tienen en cuenta lo que podra costar con la ayuda del seguro (puede ser ms barato con su seguro), pero el sitio web puede darle el precio si no utiliz Research scientist (physical sciences).  - Puede imprimir el cupn correspondiente y  llevarlo con su receta a la farmacia.  - Tambin puede pasar por nuestra oficina durante el horario de atencin regular y Charity fundraiser una tarjeta de cupones de GoodRx.  - Si necesita que su receta se enve electrnicamente a una farmacia diferente, informe a nuestra oficina a travs de MyChart de [place] o por telfono llamando al [phone] y presione la opcin 4.

## 2021-10-12 NOTE — Progress Notes (Signed)
Follow-Up Visit   Subjective  Nichole Sloan is a 76 y.o. female who presents for the following: Alopecia (Recheck scalp. 6 month recheck. Finished Finasteride 5mg  1/2 tablet daily, did not refill. Taking Plaquenil 200mg  as directed. Using Clobetasol solution as directed. Patient reports condition is stable) and lesion (Left flank. Rubbed, irritated).  Bleeding.    The following portions of the chart were reviewed this encounter and updated as appropriate:      Review of Systems: No other skin or systemic complaints except as noted in HPI or Assessment and Plan.   Objective  Well appearing patient in no apparent distress; mood and affect are within normal limits.  A focused examination was performed including head, including the scalp, face, neck, nose, ears, eyelids, and lips and left flank. Relevant physical exam findings are noted in the Assessment and Plan.  Scalp Thinning of the hair temporal scalp, L>R,  bil with alopecia and loss of follicles, receding frontal hairline with perifollicular prominence, and lonely hairs, prominent temporal veins BL, absent eyebrows   Left temporal hairline has some light hair regrowth when compared to photos  No progression/worsening  Neck - Anterior Pink tan patch, reticulated No change when compared to baseline photo  Left Flank x3 (3) Erythematous keratotic or waxy stuck-on papule   Assessment & Plan   History of Dysplastic Nevus - No evidence of recurrence today at right preauricular - Recommend regular full body skin exams - Recommend daily broad spectrum sunscreen SPF 30+ to sun-exposed areas, reapply every 2 hours as needed.  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  No cure.  Goal is to prevent progression.   Photos compared from 10/05/20 visit.  Stable, with light hair regrowth on  L temporal scalp  Continue Tacrolimus ointment at bedtime to frontal hair line at scalp. Continue Clobetasol solution at bedtime to frontal scalp behind hair line M-F, off weekends Continue Plaquenil 200mg  twice daily. Will refill pending CBC/CMP lab results. Continue eye exam as recommended  Resume Finasteride 5mg  1/2 tablet once daily.   Patient will call for refills of topicals and Finasteride as needed.   Discussed adding oral Minoxidil. Patient prefers to hold off on new medication and resume/continue current treatment regimen for now.  Frontal fibrosing alopecia (FFA) is a chronic/progressive and irreversible patterned form of scarring alopecia that presents as a symmetric band-like zone of hair loss along the anterior hairline. The eyebrows are often thinned or absent. It is considered a variant of lichen planopilaris in a patterned distribution and is more common in women. Therapies may stop or slow progression but generally do not lead to hair regrowth.   Related Procedures CBC with Differential/Platelets CMP  Related Medications tacrolimus (PROTOPIC) 0.1 % ointment Apply to affected areas frontal scalp every night as directd.  clobetasol (TEMOVATE) 0.05 % external solution Apply topically to scalp behind hairline every night  finasteride (PROSCAR) 5 MG tablet Take 1 tablet (5 mg total) by mouth daily.  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  Continue daily sunscreen spf 30+ Discussed laser, but could make pigmentation worse in melasma. Observe for now.  Melasma is a chronic condition of persistent pigmented patches generally on the face, worse in summer due to higher UV exposure.  Oral estrogen containing  BCPs or supplements can exacerbate condition.  Recommend daily broad spectrum tinted sunscreen SPF 30+ to face, preferably with Zinc or Titanium Dioxide. Discussed Rx topical bleaching  creams (i.e. hydroquinone), OTC HelioCare supplement, chemical peels (would need multiple for best result).     Related Medications hydroquinone 4 % cream Apply to affected area neck twice daily until improved.  Inflamed seborrheic keratosis (3) Left Flank x3  Destruction of lesion - Left Flank x3  Destruction method: cryotherapy   Informed consent: discussed and consent obtained   Lesion destroyed using liquid nitrogen: Yes   Region frozen until ice ball extended beyond lesion: Yes   Outcome: patient tolerated procedure well with no complications   Post-procedure details: wound care instructions given   Additional details:  Prior to procedure, discussed risks of blister formation, small wound, skin dyspigmentation, or rare scar following cryotherapy. Recommend Vaseline ointment to treated areas while healing.   Encounter for long-term (current) use of high-risk medication  Related Procedures CBC with Differential/Platelets CMP   Hemangiomas - Red papules - Discussed benign nature - Observe - Call for any changes   Return in about 6 months (around 04/11/2022) for alopecia recheck.  I, Emelia Salisbury, CMA, am acting as scribe for Brendolyn Patty, MD.  Documentation: I have reviewed the above documentation for accuracy and completeness, and I agree with the above.  Brendolyn Patty MD

## 2021-10-13 ENCOUNTER — Telehealth: Payer: Self-pay

## 2021-10-13 LAB — CBC WITH DIFFERENTIAL/PLATELET
Basophils Absolute: 0 10*3/uL (ref 0.0–0.2)
Basos: 1 %
EOS (ABSOLUTE): 0.1 10*3/uL (ref 0.0–0.4)
Eos: 1 %
Hematocrit: 40.7 % (ref 34.0–46.6)
Hemoglobin: 13.5 g/dL (ref 11.1–15.9)
Immature Grans (Abs): 0 10*3/uL (ref 0.0–0.1)
Immature Granulocytes: 0 %
Lymphocytes Absolute: 1.8 10*3/uL (ref 0.7–3.1)
Lymphs: 24 %
MCH: 29.3 pg (ref 26.6–33.0)
MCHC: 33.2 g/dL (ref 31.5–35.7)
MCV: 89 fL (ref 79–97)
Monocytes Absolute: 0.6 10*3/uL (ref 0.1–0.9)
Monocytes: 8 %
Neutrophils Absolute: 5.1 10*3/uL (ref 1.4–7.0)
Neutrophils: 66 %
Platelets: 288 10*3/uL (ref 150–450)
RBC: 4.6 x10E6/uL (ref 3.77–5.28)
RDW: 12.4 % (ref 11.7–15.4)
WBC: 7.6 10*3/uL (ref 3.4–10.8)

## 2021-10-13 LAB — COMPREHENSIVE METABOLIC PANEL
ALT: 17 IU/L (ref 0–32)
AST: 24 IU/L (ref 0–40)
Albumin/Globulin Ratio: 2.5 — ABNORMAL HIGH (ref 1.2–2.2)
Albumin: 4.8 g/dL — ABNORMAL HIGH (ref 3.7–4.7)
Alkaline Phosphatase: 86 IU/L (ref 44–121)
BUN/Creatinine Ratio: 27 (ref 12–28)
BUN: 21 mg/dL (ref 8–27)
Bilirubin Total: 0.4 mg/dL (ref 0.0–1.2)
CO2: 25 mmol/L (ref 20–29)
Calcium: 9.8 mg/dL (ref 8.7–10.3)
Chloride: 103 mmol/L (ref 96–106)
Creatinine, Ser: 0.77 mg/dL (ref 0.57–1.00)
Globulin, Total: 1.9 g/dL (ref 1.5–4.5)
Glucose: 83 mg/dL (ref 70–99)
Potassium: 4.3 mmol/L (ref 3.5–5.2)
Sodium: 140 mmol/L (ref 134–144)
Total Protein: 6.7 g/dL (ref 6.0–8.5)
eGFR: 81 mL/min/{1.73_m2} (ref >59)

## 2021-10-13 MED ORDER — HYDROXYCHLOROQUINE SULFATE 200 MG PO TABS: 200.0000 mg | ORAL_TABLET | Freq: Two times a day (BID) | ORAL | 4 refills | Status: DC

## 2021-10-13 NOTE — Telephone Encounter (Signed)
-----   Message from Brendolyn Patty, MD sent at 10/13/2021 11:27 AM EST ----- Labs ok, can send in 5 Plaquenil refills  - please call patient

## 2021-10-13 NOTE — Telephone Encounter (Signed)
Advised pt of labs, sent in 5 refills of Plaquenil to [facility] [address]sh

## 2021-10-24 ENCOUNTER — Other Ambulatory Visit: Payer: Self-pay

## 2021-10-24 DIAGNOSIS — L661 Lichen planopilaris: Secondary | ICD-10-CM

## 2021-10-24 MED ORDER — HYDROXYCHLOROQUINE SULFATE 200 MG PO TABS: 200.0000 mg | ORAL_TABLET | Freq: Two times a day (BID) | ORAL | 4 refills | Status: AC

## 2021-10-24 MED ORDER — FINASTERIDE 5 MG PO TABS: 2.5000 mg | ORAL_TABLET | Freq: Every day | ORAL | 2 refills | Status: DC

## 2022-04-25 ENCOUNTER — Ambulatory Visit: Payer: Medicare PPO | Admitting: Dermatology

## 2022-04-25 DIAGNOSIS — Z1283 Encounter for screening for malignant neoplasm of skin: Secondary | ICD-10-CM | POA: Diagnosis not present

## 2022-04-25 DIAGNOSIS — L720 Epidermal cyst: Secondary | ICD-10-CM

## 2022-04-25 DIAGNOSIS — D225 Melanocytic nevi of trunk: Secondary | ICD-10-CM

## 2022-04-25 DIAGNOSIS — D229 Melanocytic nevi, unspecified: Secondary | ICD-10-CM

## 2022-04-25 DIAGNOSIS — L820 Inflamed seborrheic keratosis: Secondary | ICD-10-CM

## 2022-04-25 DIAGNOSIS — L661 Lichen planopilaris: Secondary | ICD-10-CM | POA: Diagnosis not present

## 2022-04-25 DIAGNOSIS — L738 Other specified follicular disorders: Secondary | ICD-10-CM

## 2022-04-25 DIAGNOSIS — L578 Other skin changes due to chronic exposure to nonionizing radiation: Secondary | ICD-10-CM

## 2022-04-25 DIAGNOSIS — Z85828 Personal history of other malignant neoplasm of skin: Secondary | ICD-10-CM

## 2022-04-25 DIAGNOSIS — Z86018 Personal history of other benign neoplasm: Secondary | ICD-10-CM

## 2022-04-25 DIAGNOSIS — L811 Chloasma: Secondary | ICD-10-CM

## 2022-04-25 DIAGNOSIS — D1800 Hemangioma unspecified site: Secondary | ICD-10-CM

## 2022-04-25 MED ORDER — TACROLIMUS 0.1 % EX OINT: TOPICAL_OINTMENT | CUTANEOUS | 1 refills | Status: DC

## 2022-04-25 MED ORDER — CLOBETASOL PROPIONATE 0.05 % EX SOLN: CUTANEOUS | 3 refills | Status: DC

## 2022-04-25 MED ORDER — FINASTERIDE 5 MG PO TABS: 2.5000 mg | ORAL_TABLET | Freq: Every day | ORAL | 2 refills | Status: DC

## 2022-04-25 MED ORDER — TRIAMCINOLONE ACETONIDE 0.1 % EX CREA: TOPICAL_CREAM | CUTANEOUS | 1 refills | Status: AC

## 2022-04-25 NOTE — Progress Notes (Signed)
Follow-Up Visit   Subjective  Nichole Sloan is a 76 y.o. female who presents for the following: Follow-up.  Patient presents for 6 month follow-up Frontal fibrosing alopecia and Total-Body Skin Exam (TBSE) for skin cancer screening and mole check.  The patient has spots, moles and lesions to be evaluated, some may be new or changing. FFA is stable using clobetasol solution, tacrolimus ointment, finasteride '5mg'$  1/2 QD, and Plaquenil '200mg'$  BID. No side effects from meds. She has an itchy spot on her back. 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   She also has melasma of the anterior neck and has used SM hydroquinone 12% in past without improvement.  The following portions of the chart were reviewed this encounter and updated as appropriate:       Review of Systems:  No other skin or systemic complaints except as noted in HPI or Assessment and Plan.  Objective  Well appearing patient in no apparent distress; mood and affect are within normal limits.  A full examination was performed including scalp, head, eyes, ears, nose, lips, neck, chest, axillae, abdomen, back, buttocks, bilateral upper extremities, bilateral lower extremities, hands, feet, fingers, toes, fingernails, and toenails. All findings within normal limits unless otherwise noted below.  Scalp Thinning of the frontal and L>R temporal hairline with follicular prominence, and smooth patches without follicles on L temporal scalp, No erythema/scale  R mid back at braline x 4 Erythematous stuck-on, waxy papule  Anterior Neck Reticulated hyperpigmented patches anterior neck  R mid back at braline 3.0 mm flesh papule    Assessment & Plan  Skin cancer screening performed today.  Actinic Damage - chronic, secondary to cumulative UV radiation exposure/sun exposure over time - diffuse scaly erythematous macules with underlying dyspigmentation - Recommend daily broad spectrum  sunscreen SPF 30+ to sun-exposed areas, reapply every 2 hours as needed.  - Recommend staying in the shade or wearing long sleeves, sun glasses (UVA+UVB protection) and wide brim hats (4-inch brim around the entire circumference of the hat). - Call for new or changing lesions.  History of Squamous Cell Carcinoma of the Skin - No evidence of recurrence today of the left upper forehead - Recommend regular full body skin exams - Recommend daily broad spectrum sunscreen SPF 30+ to sun-exposed areas, reapply every 2 hours as needed.  - Call if any new or changing lesions are noted between office visits  History of Dysplastic Nevus - No evidence of recurrence today of the right preauricular - Recommend regular full body skin exams - Recommend daily broad spectrum sunscreen SPF 30+ to sun-exposed areas, reapply every 2 hours as needed.  - Call if any new or changing lesions are noted between office visits  Hemangiomas - Red papules - Discussed benign nature - Observe - Call for any changes  Sebaceous Hyperplasia - Small yellow papules with a central dell - Benign - Observe  Milia - tiny firm white papules - type of cyst - benign - may be extracted if symptomatic - observe - Start Effaclar 0.1% gel qhs as tolerated, samles given.   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  No cure.  Goal is to prevent progression.  Frontal fibrosing alopecia (FFA) is a chronic/progressive and irreversible patterned form of scarring alopecia that presents as a symmetric band-like zone of hair loss along the anterior hairline. The eyebrows are often thinned or absent. It is considered a  variant of lichen planopilaris in a patterned distribution and is more common in women. Therapies may stop or slow progression but generally do not lead to hair regrowth.  Continue Tacrolimus ointment at bedtime to frontal hair line at scalp. Rfs  sent. Continue Clobetasol solution at bedtime to frontal scalp behind hair line M-F, off weekends. Rfs sent. Continue Plaquenil '200mg'$  - may try to decrease to QD. Pt has, will call for refills. (Started Jan 2022). Continue Finasteride '5mg'$  1/2 tablet once daily. Rfs sent.   Continue yearly eye exams, patient has eye exam scheduled this fall. Will have labs (CBC/diff, CMP) done by PCP at yearly exam.   Related Medications tacrolimus (PROTOPIC) 0.1 % ointment Apply to affected areas frontal scalp every night as directd.  clobetasol (TEMOVATE) 0.05 % external solution Apply topically to scalp behind hairline every night. Avoid face.  finasteride (PROSCAR) 5 MG tablet Take 0.5 tablets (2.5 mg total) by mouth daily.  Inflamed seborrheic keratosis R mid back at braline x 4  Symptomatic, irritating, patient would like treated.  Start TMC 0.1% Cream Apply to AA back QD/BID prn itch dsp 60g 1Rf. Avoid face, groin, axilla.   Topical steroids (such as triamcinolone, fluocinolone, fluocinonide, mometasone, clobetasol, halobetasol, betamethasone, hydrocortisone) can cause thinning and lightening of the skin if they are used for too long in the same area. Your physician has selected the right strength medicine for your problem and area affected on the body. Please use your medication only as directed by your physician to prevent side effects.    Destruction of lesion - R mid back at braline x 4  Destruction method: cryotherapy   Informed consent: discussed and consent obtained   Lesion destroyed using liquid nitrogen: Yes   Region frozen until ice ball extended beyond lesion: Yes   Outcome: patient tolerated procedure well with no complications   Post-procedure details: wound care instructions given   Additional details:  Prior to procedure, discussed risks of blister formation, small wound, skin dyspigmentation, or rare scar following cryotherapy. Recommend Vaseline ointment to treated areas  while healing.   triamcinolone cream (KENALOG) 0.1 % - R mid back at braline x 4 Apply to itchy spots on back once to twice daily as needed. Avoid face, groin, axilla.  Melasma Anterior Neck  Chronic and persistent condition with duration or expected duration over one year. Condition is symptomatic/ bothersome to patient. Not currently at goal.   Melasma is a chronic; persistent condition of hyperpigmented patches generally on the face, worse in summer due to higher UV exposure.    Heredity; thyroid disease; sun exposure; pregnancy; birth control pills; epilepsy medication and darker skin may predispose to Melasma.   Recommendations include: - Sun avoidance and daily broad spectrum (UVA/UVB) sunscreen SPF 30+, preferably with Zinc or Titanium Dioxide. - Rx topical bleaching creams (i.e. hydroquinone) is a common treatment but should not be used long term.  Hydroquinones may be mixed with retinoids; steroids; Kojic Acid. - Rx Azelaic Acid is also a treatment option that is safe for pregnancy (Category B). - OTC Heliocare can be helpful in control and prevention. - Oral Rx with Tranexamic Acid 250 mg - 650 mg po daily can be used for moderate to severe cases especially during summer (contraindications include pregnancy; lactation; hx of PE; DVT; clotting disorder; heart disease; anticoagulant use and upcoming long trips)   - Chemical peels (would need multiple for best result).  - Lasers and  Microdermabrasion may also be helpful adjunct treatments.  Start Hydroquinone: 8%, Tretinoin: 0.1%, Kojic Acid: 1%, Niacinamide: 4%, Fluocinolone: 0.025% Cream apply to AA qhs dsp 30g 4Rf. Rx sent to [facility].  Continue Spf 30+ sunscreen with Zinc qam  Related Medications hydroquinone 4 % cream Apply to affected area neck twice daily until improved.  Nevus R mid back at braline  Benign-appearing.  Observation.  Call clinic for new or changing moles.  Recommend daily use of broad spectrum spf  30+ sunscreen to sun-exposed areas.   Discussed shave removal if she continues to have itching in area after ISK cryotherapy   Return in about 6 months (around 10/26/2022) for FFA.  IJamesetta Orleans, CMA, am acting as scribe for Brendolyn Patty, MD .  Documentation: I have reviewed the above documentation for accuracy and completeness, and I agree with the above.  Brendolyn Patty MD

## 2022-04-25 NOTE — Patient Instructions (Addendum)
Cryotherapy Aftercare  Wash gently with soap and water everyday.   Apply Vaseline and Band-Aid daily until healed.   Instructions for [facility] Medications  One or more of your medications was sent to the [facility] mail order compounding pharmacy. You will receive an email from them and can purchase the medicine through that link. It will then be mailed to your home at the address you confirmed. If for any reason you do not receive an email from them, please check your spam folder. If you still do not find the email, please let us know. [facility] phone number is [phone].   Start Triamcinolone 0.1% Cream Apply to itchy spots on back one to two times a day until improved. Avoid face, groin, underarms.  Topical steroids (such as triamcinolone, fluocinolone, fluocinonide, mometasone, clobetasol, halobetasol, betamethasone, hydrocortisone) can cause thinning and lightening of the skin if they are used for too long in the same area. Your physician has selected the right strength medicine for your problem and area affected on the body. Please use your medication only as directed by your physician to prevent side effects.     Due to recent changes in healthcare laws, you may see results of your pathology and/or laboratory studies on MyChart before the doctors have had a chance to review them. We understand that in some cases there may be results that are confusing or concerning to you. Please understand that not all results are received at the same time and often the doctors may need to interpret multiple results in order to provide you with the best plan of care or course of treatment. Therefore, we ask that you please give [place] 2 business days to thoroughly review all your results before contacting the office for clarification. Should we see a critical lab result, you will be contacted sooner.   If You Need Anything After Your Visit  If you have any questions or concerns for your  doctor, please call our main line at [phone] and press option 4 to reach your doctor's medical assistant. If no one answers, please leave a voicemail as directed and we will return your call as soon as possible. Messages left after 4 pm will be answered the following business day.   You may also send [place] a message via [place]. We typically respond to MyChart messages within 1-2 business days.  For prescription refills, please ask your pharmacy to contact our office. Our fax number is [phone].  If you have an urgent issue when the clinic is closed that cannot wait until the next business day, you can page your doctor at the number below.    Please note that while we do our best to be available for urgent issues outside of office hours, we are not available 24/7.   If you have an urgent issue and are unable to reach [place], you may choose to seek medical care at your doctor's office, retail clinic, urgent care center, or emergency room.  If you have a medical emergency, please immediately call 911 or go to the emergency department.  Pager Numbers  - Dr. Nehemiah Massed: [phone]  - Dr. Laurence Ferrari: [phone]  - Dr. Nicole Kindred: [phone]  In the event of inclement weather, please call our main line at [phone] for an update on the status of any delays or closures.  Dermatology Medication Tips: Please keep the boxes that topical medications come in in order to help keep track of the instructions about where and how to use these.  Pharmacies typically print the medication instructions only on the boxes and not directly on the medication tubes.   If your medication is too expensive, please contact our office at [phone] option 4 or send [place] a message through [place].   We are unable to tell what your co-pay for medications will be in advance as this is different depending on your insurance coverage. However, we may be able to find a substitute medication at lower cost or fill out paperwork  to get insurance to cover a needed medication.   If a prior authorization is required to get your medication covered by your insurance company, please allow [place] 1-2 business days to complete this process.  Drug prices often vary depending on where the prescription is filled and some pharmacies may offer cheaper prices.  The website www.goodrx.com contains coupons for medications through different pharmacies. The prices here do not account for what the cost may be with help from insurance (it may be cheaper with your insurance), but the website can give you the price if you did not use any insurance.  - You can print the associated coupon and take it with your prescription to the pharmacy.  - You may also stop by our office during regular business hours and pick up a GoodRx coupon card.  - If you need your prescription sent electronically to a different pharmacy, notify our office through [facility] or by phone at [phone] option 4.     Si Usted Necesita Algo Despus de Su Visita  Tambin puede enviarnos un mensaje a travs de Pharmacist, community. Por lo general respondemos a los mensajes de MyChart en el transcurso de 1 a 2 das hbiles.  Para renovar recetas, por favor pida a su farmacia que se ponga en contacto con nuestra oficina. Harland Dingwall de fax es [place] [phone].  Si tiene un asunto urgente cuando la clnica est cerrada y que no puede esperar hasta el siguiente da hbil, puede llamar/localizar a su doctor(a) al nmero que aparece a continuacin.   Por favor, tenga en cuenta que aunque hacemos todo lo posible para estar disponibles para asuntos urgentes fuera del horario de [place], no estamos disponibles las 24 horas del da, los 7 das de la [place].   Si tiene un problema urgente y no puede comunicarse con nosotros, puede optar por buscar atencin mdica  en el consultorio de su doctor(a), en una clnica privada, en un centro de atencin urgente o en una sala de  emergencias.  Si tiene Engineering geologist, por favor llame inmediatamente al 911 o vaya a la sala de emergencias.  Nmeros de bper  - Dr. Nehemiah Massed: [phone]  - Dra. Moye: [phone]  - Dra. Nicole Kindred: [phone]  En caso de inclemencias del [place], por favor llame a Johnsie Kindred principal al [phone] para una actualizacin sobre el [place] de cualquier retraso o cierre.  Consejos para la medicacin en dermatologa: Por favor, guarde las cajas en las que vienen los medicamentos de uso tpico para ayudarle a seguir las instrucciones sobre dnde y cmo usarlos. Las farmacias generalmente imprimen las instrucciones del medicamento slo en las cajas y no directamente en los tubos del [place].   Si su medicamento es muy caro, por favor, pngase en contacto con Zigmund Daniel llamando al [phone] y presione la opcin 4 o envenos un mensaje a travs de Pharmacist, community.   No podemos decirle cul ser su copago por los medicamentos por adelantado ya que esto es diferente dependiendo de la Field seismologist  de su seguro. Sin embargo, es posible que podamos encontrar un medicamento sustituto a Electrical engineer un formulario para que el seguro cubra el medicamento que se considera necesario.   Si se requiere una autorizacin previa para que su compaa de seguros [place] su medicamento, por favor permtanos de 1 a 2 das hbiles para completar este proceso.  Los precios de los medicamentos varan con frecuencia dependiendo del Environmental consultant de dnde se surte la receta y alguna farmacias pueden ofrecer precios ms baratos.  El sitio web www.goodrx.com tiene cupones para medicamentos de Airline pilot. Los precios aqu no tienen en cuenta lo que podra costar con la ayuda del seguro (puede ser ms barato con su seguro), pero el sitio web puede darle el precio si no utiliz Research scientist (physical sciences).  - Puede imprimir el cupn correspondiente y llevarlo con su receta a la farmacia.  - Tambin puede pasar por  nuestra oficina durante el horario de atencin regular y Charity fundraiser una tarjeta de cupones de GoodRx.  - Si necesita que su receta se enve electrnicamente a una farmacia diferente, informe a nuestra oficina a travs de MyChart de [place] o por telfono llamando al [phone] y presione la opcin 4.

## 2022-06-02 ENCOUNTER — Other Ambulatory Visit: Payer: Self-pay | Admitting: Family Medicine

## 2022-06-02 DIAGNOSIS — Z1231 Encounter for screening mammogram for malignant neoplasm of breast: Secondary | ICD-10-CM

## 2022-07-06 ENCOUNTER — Ambulatory Visit
Admission: RE | Admit: 2022-07-06 | Discharge: 2022-07-06 | Disposition: A | Discharge status: Home / Self Care | Payer: Medicare PPO | Source: Ambulatory Visit | Attending: Family Medicine | Admitting: Family Medicine

## 2022-07-06 DIAGNOSIS — Z1231 Encounter for screening mammogram for malignant neoplasm of breast: Secondary | ICD-10-CM | POA: Diagnosis present

## 2022-09-06 ENCOUNTER — Ambulatory Visit: Payer: Medicare PPO | Admitting: Dermatology

## 2022-09-06 DIAGNOSIS — D2322 Other benign neoplasm of skin of left ear and external auricular canal: Secondary | ICD-10-CM

## 2022-09-06 DIAGNOSIS — L6612 Frontal fibrosing alopecia: Secondary | ICD-10-CM

## 2022-09-06 DIAGNOSIS — Z79899 Other long term (current) drug therapy: Secondary | ICD-10-CM

## 2022-09-06 DIAGNOSIS — L661 Lichen planopilaris: Secondary | ICD-10-CM | POA: Diagnosis not present

## 2022-09-06 DIAGNOSIS — L719 Rosacea, unspecified: Secondary | ICD-10-CM

## 2022-09-06 DIAGNOSIS — L820 Inflamed seborrheic keratosis: Secondary | ICD-10-CM | POA: Diagnosis not present

## 2022-09-06 MED ORDER — DOXYCYCLINE MONOHYDRATE 100 MG PO CAPS: ORAL_CAPSULE | ORAL | 1 refills | Status: DC

## 2022-09-06 MED ORDER — CLOBETASOL PROPIONATE 0.05 % EX SOLN: CUTANEOUS | 3 refills | Status: DC

## 2022-09-06 MED ORDER — FINASTERIDE 5 MG PO TABS: 5.0000 mg | ORAL_TABLET | Freq: Every day | ORAL | 2 refills | Status: DC

## 2022-09-06 MED ORDER — TACROLIMUS 0.1 % EX OINT: TOPICAL_OINTMENT | CUTANEOUS | 1 refills | Status: DC

## 2022-09-06 MED ORDER — HYDROXYCHLOROQUINE SULFATE 200 MG PO TABS: ORAL_TABLET | ORAL | 2 refills | Status: DC

## 2022-09-06 MED ORDER — METRONIDAZOLE 0.75 % EX CREA: TOPICAL_CREAM | CUTANEOUS | 0 refills | Status: DC

## 2022-09-06 NOTE — Patient Instructions (Addendum)
Cryotherapy Aftercare  Wash gently with soap and water everyday.   Apply Vaseline and Band-Aid daily until healed.     Rosacea  What is rosacea? Rosacea (say: ro-zay-sha) is a common skin disease that usually begins as a trend of flushing or blushing easily.  As rosacea progresses, a persistent redness in the center of the face will develop and may gradually spread beyond the nose and cheeks to the forehead and chin.  In some cases, the ears, chest, and back could be affected.  Rosacea may appear as tiny blood vessels or small red bumps that occur in crops.  Frequently they can contain pus, and are called "pustules".  If the bumps do not contain pus, they are referred to as "papules".  Rarely, in prolonged, untreated cases of rosacea, the oil glands of the nose and cheeks may become permanently enlarged.  This is called rhinophyma, and is seen more frequently in men.  Signs and Risks In its beginning stages, rosacea tends to come and go, which makes it difficult to recognize.  It can start as intermittent flushing of the face.  Eventually, blood vessels may become permanently visible.  Pustules and papules can appear, but can be mistaken for adult acne.  People of all races, ages, genders and ethnic groups are at risk of developing rosacea.  However, it is more common in women (especially around menopause) and adults with fair skin between the ages of 17 and 12.  Treatment Dermatologists typically recommend a combination of treatments to effectively manage rosacea.  Treatment can improve symptoms and may stop the progression of the rosacea.  Treatment may involve both topical and oral medications.  The tetracycline antibiotics are often used for their anti-inflammatory effect; however, because of the possibility of developing antibiotic resistance, they should not be used long term at full dose.  For dilated blood vessels the options include electrodessication (uses electric current through a small  needle), laser treatment, and cosmetics to hide the redness.   With all forms of treatment, improvement is a slow process, and patients may not see any results for the first 3-4 weeks.  It is very important to avoid the sun and other triggers.  Patients must wear sunscreen daily.  Skin Care Instructions: Cleanse the skin with a mild soap such as CeraVe cleanser, Cetaphil cleanser, or Dove soap once or twice daily as needed. Moisturize with Eucerin Redness Relief Daily Perfecting Lotion (has a subtle green tint), CeraVe Moisturizing Cream, or Oil of Olay Daily Moisturizer with sunscreen every morning and/or night as recommended. Makeup should be "non-comedogenic" (won't clog pores) and be labeled "for sensitive skin". Good choices for cosmetics are: Neutrogena, Almay, and Physician's Formula.  Any product with a green tint tends to offset a red complexion. If your eyes are dry and irritated, use artificial tears 2-3 times per day and cleanse the eyelids daily with baby shampoo.  Have your eyes examined at least every 2 years.  Be sure to tell your eye doctor that you have rosacea. Alcoholic beverages tend to cause flushing of the skin, and may make rosacea worse. Always wear sunscreen, protect your skin from extreme hot and cold temperatures, and avoid spicy foods, hot drinks, and mechanical irritation such as rubbing, scrubbing, or massaging the face.  Avoid harsh skin cleansers, cleansing masks, astringents, and exfoliation. If a particular product burns or makes your face feel tight, then it is likely to flare your rosacea. If you are having difficulty finding a sunscreen that you  can tolerate, you may try switching to a chemical-free sunscreen.  These are ones whose active ingredient is zinc oxide or titanium dioxide only.  They should also be fragrance free, non-comedogenic, and labeled for sensitive skin. Rosacea triggers may vary from person to person.  There are a variety of foods that have been  reported to trigger rosacea.  Some patients find that keeping a diary of what they were doing when they flared helps them avoid triggers.   Doxycycline should be taken with food to prevent nausea. Do not lay down for 30 minutes after taking. Be cautious with sun exposure and use good sun protection while on this medication. Pregnant women should not take this medication.      Due to recent changes in healthcare laws, you may see results of your pathology and/or laboratory studies on MyChart before the doctors have had a chance to review them. We understand that in some cases there may be results that are confusing or concerning to you. Please understand that not all results are received at the same time and often the doctors may need to interpret multiple results in order to provide you with the best plan of care or course of treatment. Therefore, we ask that you please give [place] 2 business days to thoroughly review all your results before contacting the office for clarification. Should we see a critical lab result, you will be contacted sooner.   If You Need Anything After Your Visit  If you have any questions or concerns for your doctor, please call our main line at [phone] and press option 4 to reach your doctor's medical assistant. If no one answers, please leave a voicemail as directed and we will return your call as soon as possible. Messages left after 4 pm will be answered the following business day.   You may also send [place] a message via [place]. We typically respond to MyChart messages within 1-2 business days.  For prescription refills, please ask your pharmacy to contact our office. Our fax number is [phone].  If you have an urgent issue when the clinic is closed that cannot wait until the next business day, you can page your doctor at the number below.    Please note that while we do our best to be available for urgent issues outside of office hours, we are not available 24/7.    If you have an urgent issue and are unable to reach [place], you may choose to seek medical care at your doctor's office, retail clinic, urgent care center, or emergency room.  If you have a medical emergency, please immediately call 911 or go to the emergency department.  Pager Numbers  - Dr. Nehemiah Massed: [phone]  - Dr. Laurence Ferrari: [phone]  - Dr. Nicole Kindred: [phone]  In the event of inclement weather, please call our main line at [phone] for an update on the status of any delays or closures.  Dermatology Medication Tips: Please keep the boxes that topical medications come in in order to help keep track of the instructions about where and how to use these. Pharmacies typically print the medication instructions only on the boxes and not directly on the medication tubes.   If your medication is too expensive, please contact our office at [phone] option 4 or send [place] a message through [place].   We are unable to tell what your co-pay for medications will be in advance as this is different depending on your insurance coverage. However, we may be able to  find a substitute medication at lower cost or fill out paperwork to get insurance to cover a needed medication.   If a prior authorization is required to get your medication covered by your insurance company, please allow [place] 1-2 business days to complete this process.  Drug prices often vary depending on where the prescription is filled and some pharmacies may offer cheaper prices.  The website www.goodrx.com contains coupons for medications through different pharmacies. The prices here do not account for what the cost may be with help from insurance (it may be cheaper with your insurance), but the website can give you the price if you did not use any insurance.  - You can print the associated coupon and take it with your prescription to the pharmacy.  - You may also stop by our office during regular business hours and pick up a  GoodRx coupon card.  - If you need your prescription sent electronically to a different pharmacy, notify our office through [facility] or by phone at [phone] option 4.     Si Usted Necesita Algo Despus de Su Visita  Tambin puede enviarnos un mensaje a travs de Pharmacist, community. Por lo general respondemos a los mensajes de MyChart en el transcurso de 1 a 2 das hbiles.  Para renovar recetas, por favor pida a su farmacia que se ponga en contacto con nuestra oficina. Harland Dingwall de fax es [place] [phone].  Si tiene un asunto urgente cuando la clnica est cerrada y que no puede esperar hasta el siguiente da hbil, puede llamar/localizar a su doctor(a) al nmero que aparece a continuacin.   Por favor, tenga en cuenta que aunque hacemos todo lo posible para estar disponibles para asuntos urgentes fuera del horario de [place], no estamos disponibles las 24 horas del da, los 7 das de la [place].   Si tiene un problema urgente y no puede comunicarse con nosotros, puede optar por buscar atencin mdica  en el consultorio de su doctor(a), en una clnica privada, en un centro de atencin urgente o en una sala de emergencias.  Si tiene Engineering geologist, por favor llame inmediatamente al 911 o vaya a la sala de emergencias.  Nmeros de bper  - Dr. Nehemiah Massed: [phone]  - Dra. Moye: [phone]  - Dra. Nicole Kindred: [phone]  En caso de inclemencias del [place], por favor llame a Johnsie Kindred principal al [phone] para una actualizacin sobre el [place] de cualquier retraso o cierre.  Consejos para la medicacin en dermatologa: Por favor, guarde las cajas en las que vienen los medicamentos de uso tpico para ayudarle a seguir las instrucciones sobre dnde y cmo usarlos. Las farmacias generalmente imprimen las instrucciones del medicamento slo en las cajas y no directamente en los tubos del [place].   Si su medicamento es muy caro, por favor, pngase en contacto con  Zigmund Daniel llamando al [phone] y presione la opcin 4 o envenos un mensaje a travs de Pharmacist, community.   No podemos decirle cul ser su copago por los medicamentos por adelantado ya que esto es diferente dependiendo de la cobertura de su seguro. Sin embargo, es posible que podamos encontrar un medicamento sustituto a Electrical engineer un formulario para que el seguro cubra el medicamento que se considera necesario.   Si se requiere una autorizacin previa para que su compaa de seguros [place] su medicamento, por favor permtanos de 1 a 2 das hbiles para completar este proceso.  Los precios de los medicamentos varan con frecuencia dependiendo  del lugar de dnde se surte la receta y alguna farmacias pueden ofrecer precios ms baratos.  El sitio web www.goodrx.com tiene cupones para medicamentos de Airline pilot. Los precios aqu no tienen en cuenta lo que podra costar con la ayuda del seguro (puede ser ms barato con su seguro), pero el sitio web puede darle el precio si no utiliz Research scientist (physical sciences).  - Puede imprimir el cupn correspondiente y llevarlo con su receta a la farmacia.  - Tambin puede pasar por nuestra oficina durante el horario de atencin regular y Charity fundraiser una tarjeta de cupones de GoodRx.  - Si necesita que su receta se enve electrnicamente a una farmacia diferente, informe a nuestra oficina a travs de MyChart de [place] o por telfono llamando al [phone] y presione la opcin 4.

## 2022-09-06 NOTE — Progress Notes (Signed)
Follow-Up Visit   Subjective  Nichole Sloan is a 76 y.o. female who presents for the following: Skin Problem. The patient has spots on her face, neck and ears to be evaluated, some may be new or changing.  Refill plaquenil for hair loss (FFA)- takes daily, hair loss is stable. No itching, no rash in scalp. Recheck left ear history of bx-proven [facility] patient report this area still painful. Still sleeps on left side.  Spots on L neck get irritated.   The following portions of the chart were reviewed this encounter and updated as appropriate:      Review of Systems:  No other skin or systemic complaints except as noted in HPI or Assessment and Plan.  Objective  Well appearing patient in no apparent distress; mood and affect are within normal limits.  A focused examination was performed including face. Relevant physical exam findings are noted in the Assessment and Plan.  nasal tip Pink papules c/w inflammatory papules, came up about a month ago per pt  left posterior neck x 5, right perioral x 1  (6) (6) Stuck-on, waxy, tan-brown papules with erythema/crusting    Scalp Thinning of the frontal and L>R temporal hairline with follicular prominence, and smooth patches without follicles on L temporal scalp, No erythema/scale    left antihelix x 1 Indistinct pink papule with small central ulceration   Assessment & Plan  Rosacea nasal tip  Chronic and persistent condition with duration or expected duration over one year. Condition is bothersome/symptomatic for patient. Currently flared.   Rosacea is a chronic progressive skin condition usually affecting the face of adults, causing redness and/or acne bumps. It is treatable but not curable. It sometimes affects the eyes (ocular rosacea) as well. It may respond to topical and/or systemic medication and can flare with stress, sun exposure, alcohol, exercise, topical steroids (including hydrocortisone/cortisone 10) and some foods.  Daily  application of broad spectrum spf 30+ sunscreen to face is recommended to reduce flares.   Start Doxycyline Monohydrate 100 mg take 1 tablet daily with food until clear, then prn flares Start Metrocream 0.75% apply to affected area on the face and nose QD-BID  doxycycline (MONODOX) 100 MG capsule - nasal tip Take 1 tablet daily with food  metroNIDAZOLE (METROCREAM) 0.75 % cream - nasal tip Apply to affected areas on face and nose qd-bid  Inflamed seborrheic keratosis (6) left posterior neck x 5, right perioral x 1  (6)  Symptomatic, irritating, patient would like treated.   Destruction of lesion - left posterior neck x 5, right perioral x 1  (6)  Destruction method: cryotherapy   Informed consent: discussed and consent obtained   Lesion destroyed using liquid nitrogen: Yes   Region frozen until ice ball extended beyond lesion: Yes   Outcome: patient tolerated procedure well with no complications   Post-procedure details: wound care instructions given   Additional details:  Prior to procedure, discussed risks of blister formation, small wound, skin dyspigmentation, or rare scar following cryotherapy. Recommend Vaseline ointment to treated areas while healing.   Related Medications triamcinolone cream (KENALOG) 0.1 % Apply to itchy spots on back once to twice daily as needed. Avoid face, groin, axilla.  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  No cure.  Goal is to prevent progression.  No worsening with decrease in Plaquenil dose to qd.   Frontal fibrosing alopecia (FFA) is a chronic/progressive and irreversible patterned form of scarring alopecia that  presents as a symmetric band-like zone of hair loss along the anterior hairline. The eyebrows are often thinned or absent. It is considered a variant of lichen planopilaris in a patterned distribution and is more common in women. Therapies may stop or slow  progression but generally do not lead to hair regrowth.   Continue Tacrolimus ointment at bedtime to frontal hair line at scalp. Rfs sent. Continue Clobetasol solution at bedtime to frontal scalp behind hair line M-F, off weekends. Rfs sent. Continue Plaquenil '200mg'$  QD.  Continue Finasteride '5mg'$  1/2 tablet once daily. Rfs sent.   Continue yearly eye exams Labs (CBC/diff, CMP) done by PCP at yearly exam. 05/30/22 labs nl CBC/diff, CMP  Hydroxychloroquine (plaquenil) can rarely cause irreversible damage to the retina of the eye if used for a long period of time. This damage can be identified and the medication stopped before vision changes are noticed by going for a yearly ophthalmology exam. Rarely, this medicine can cause low blood counts, liver inflammation, and/or a color change of the skin.  The use of Plaquenil requires long term medication management, including periodic office visits and monitoring of blood work.   finasteride (PROSCAR) 5 MG tablet - Scalp Take 1 tablet (5 mg total) by mouth daily.  clobetasol (TEMOVATE) 0.05 % external solution - Scalp Apply topically to scalp behind hairline every night. Avoid face.  tacrolimus (PROTOPIC) 0.1 % ointment - Scalp Apply to affected areas frontal scalp every night as directd.  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   May recur with continued pressure to left ear while sleeping on left side. Try to avoid sleeping on left side. Use donut pillow or travel pillow to avoid pressure to the area.  LN2 x 1   Destruction of lesion - left antihelix x 1  Destruction method: cryotherapy   Informed consent: discussed and consent obtained   Lesion destroyed using liquid nitrogen: Yes   Region frozen until ice ball extended beyond  lesion: Yes   Outcome: patient tolerated procedure well with no complications   Post-procedure details: wound care instructions given   Additional details:  Prior to procedure, discussed risks of blister formation, small wound, skin dyspigmentation, or rare scar following cryotherapy. Recommend Vaseline ointment to treated areas while healing.    Return for scheduled appointment in March.  I, Marye Round, CMA, am acting as scribe for Brendolyn Patty, MD .   Documentation: I have reviewed the above documentation for accuracy and completeness, and I agree with the above.  Brendolyn Patty MD

## 2022-10-31 ENCOUNTER — Other Ambulatory Visit: Payer: Self-pay | Admitting: Dermatology

## 2022-10-31 DIAGNOSIS — L719 Rosacea, unspecified: Secondary | ICD-10-CM

## 2022-11-01 ENCOUNTER — Other Ambulatory Visit: Payer: Self-pay | Admitting: Dermatology

## 2022-11-01 DIAGNOSIS — L661 Lichen planopilaris: Secondary | ICD-10-CM

## 2022-11-14 ENCOUNTER — Ambulatory Visit: Payer: Medicare PPO | Admitting: Dermatology

## 2022-11-14 VITALS — BP 110/61 | HR 72

## 2022-11-14 DIAGNOSIS — L661 Lichen planopilaris: Secondary | ICD-10-CM | POA: Diagnosis not present

## 2022-11-14 MED ORDER — MINOXIDIL 2.5 MG PO TABS: 2.5000 mg | ORAL_TABLET | Freq: Every day | ORAL | 2 refills | Status: DC

## 2022-11-14 NOTE — Patient Instructions (Addendum)
Start minoxidil 2.5 MG take 1/2 tablet daily.   Due to recent changes in healthcare laws, you may see results of your pathology and/or laboratory studies on MyChart before the doctors have had a chance to review them. We understand that in some cases there may be results that are confusing or concerning to you. Please understand that not all results are received at the same time and often the doctors may need to interpret multiple results in order to provide you with the best plan of care or course of treatment. Therefore, we ask that you please give [place] 2 business days to thoroughly review all your results before contacting the office for clarification. Should we see a critical lab result, you will be contacted sooner.   If You Need Anything After Your Visit  If you have any questions or concerns for your doctor, please call our main line at [phone] and press option 4 to reach your doctor's medical assistant. If no one answers, please leave a voicemail as directed and we will return your call as soon as possible. Messages left after 4 pm will be answered the following business day.   You may also send [place] a message via [place]. We typically respond to MyChart messages within 1-2 business days.  For prescription refills, please ask your pharmacy to contact our office. Our fax number is [phone].  If you have an urgent issue when the clinic is closed that cannot wait until the next business day, you can page your doctor at the number below.    Please note that while we do our best to be available for urgent issues outside of office hours, we are not available 24/7.   If you have an urgent issue and are unable to reach [place], you may choose to seek medical care at your doctor's office, retail clinic, urgent care center, or emergency room.  If you have a medical emergency, please immediately call 911 or go to the emergency department.  Pager Numbers  - Dr. Nehemiah Massed: [phone]  - Dr.  Laurence Ferrari: [phone]  - Dr. Nicole Kindred: [phone]  In the event of inclement weather, please call our main line at [phone] for an update on the status of any delays or closures.  Dermatology Medication Tips: Please keep the boxes that topical medications come in in order to help keep track of the instructions about where and how to use these. Pharmacies typically print the medication instructions only on the boxes and not directly on the medication tubes.   If your medication is too expensive, please contact our office at [phone] option 4 or send [place] a message through [place].   We are unable to tell what your co-pay for medications will be in advance as this is different depending on your insurance coverage. However, we may be able to find a substitute medication at lower cost or fill out paperwork to get insurance to cover a needed medication.   If a prior authorization is required to get your medication covered by your insurance company, please allow [place] 1-2 business days to complete this process.  Drug prices often vary depending on where the prescription is filled and some pharmacies may offer cheaper prices.  The website www.goodrx.com contains coupons for medications through different pharmacies. The prices here do not account for what the cost may be with help from insurance (it may be cheaper with your insurance), but the website can give you the price if you did not use any insurance.  -  You can print the associated coupon and take it with your prescription to the pharmacy.  - You may also stop by our office during regular business hours and pick up a GoodRx coupon card.  - If you need your prescription sent electronically to a different pharmacy, notify our office through [facility] or by phone at [phone] option 4.     Si Usted Necesita Algo Despus de Su Visita  Tambin puede enviarnos un mensaje a travs de Pharmacist, community. Por lo general respondemos a los mensajes  de MyChart en el transcurso de 1 a 2 das hbiles.  Para renovar recetas, por favor pida a su farmacia que se ponga en contacto con nuestra oficina. Harland Dingwall de fax es [place] [phone].  Si tiene un asunto urgente cuando la clnica est cerrada y que no puede esperar hasta el siguiente da hbil, puede llamar/localizar a su doctor(a) al nmero que aparece a continuacin.   Por favor, tenga en cuenta que aunque hacemos todo lo posible para estar disponibles para asuntos urgentes fuera del horario de [place], no estamos disponibles las 24 horas del da, los 7 das de la [place].   Si tiene un problema urgente y no puede comunicarse con nosotros, puede optar por buscar atencin mdica  en el consultorio de su doctor(a), en una clnica privada, en un centro de atencin urgente o en una sala de emergencias.  Si tiene Engineering geologist, por favor llame inmediatamente al 911 o vaya a la sala de emergencias.  Nmeros de bper  - Dr. Nehemiah Massed: [phone]  - Dra. Moye: [phone]  - Dra. Nicole Kindred: [phone]  En caso de inclemencias del [place], por favor llame a Johnsie Kindred principal al [phone] para una actualizacin sobre el [place] de cualquier retraso o cierre.  Consejos para la medicacin en dermatologa: Por favor, guarde las cajas en las que vienen los medicamentos de uso tpico para ayudarle a seguir las instrucciones sobre dnde y cmo usarlos. Las farmacias generalmente imprimen las instrucciones del medicamento slo en las cajas y no directamente en los tubos del [place].   Si su medicamento es muy caro, por favor, pngase en contacto con Zigmund Daniel llamando al [phone] y presione la opcin 4 o envenos un mensaje a travs de Pharmacist, community.   No podemos decirle cul ser su copago por los medicamentos por adelantado ya que esto es diferente dependiendo de la cobertura de su seguro. Sin embargo, es posible que podamos encontrar un medicamento sustituto a Actor un formulario para que el seguro cubra el medicamento que se considera necesario.   Si se requiere una autorizacin previa para que su compaa de seguros [place] su medicamento, por favor permtanos de 1 a 2 das hbiles para completar este proceso.  Los precios de los medicamentos varan con frecuencia dependiendo del Environmental consultant de dnde se surte la receta y alguna farmacias pueden ofrecer precios ms baratos.  El sitio web www.goodrx.com tiene cupones para medicamentos de Airline pilot. Los precios aqu no tienen en cuenta lo que podra costar con la ayuda del seguro (puede ser ms barato con su seguro), pero el sitio web puede darle el precio si no utiliz Research scientist (physical sciences).  - Puede imprimir el cupn correspondiente y llevarlo con su receta a la farmacia.  - Tambin puede pasar por nuestra oficina durante el horario de atencin regular y Charity fundraiser una tarjeta de cupones de GoodRx.  - Si necesita que su receta se enve electrnicamente a una farmacia diferente, informe  a nuestra oficina a travs de MyChart de [place] o por telfono llamando al [phone] y presione la opcin 4.

## 2022-11-14 NOTE — Progress Notes (Signed)
   Follow-Up Visit   Subjective  Nichole Sloan is a 77 y.o. female who presents for the following: Follow-up.  Patient presents for follow-up FFA of the scalp. She thinks she is slightly worse on the left frontal scalp. She is using tacrolimus ointment, clobetasol solution, Plaquenil 200 mg daily, and finasteride 5 mg 1/2 tablet daily. She also has a dark growth on the left flank at bra line that she noticed 4-5 months ago.  The following portions of the chart were reviewed this encounter and updated as appropriate:      Review of Systems:  No other skin or systemic complaints except as noted in HPI or Assessment and Plan.  Objective  Well appearing patient in no apparent distress; mood and affect are within normal limits.  A focused examination was performed including face, scalp, breast. Relevant physical exam findings are noted in the Assessment and Plan.  Scalp Increased thinning of the bil temporal and frontal hairline, no erythema. Scarring alopecia with scattered follicular prominence of the frontal hairline. Thinning of the occipital scalp at hairline L > R.    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 Possibly flare due to recent thyroid abnormalities.  Chronic and persistent condition with duration or expected duration over one year. Condition is symptomatic/ bothersome to patient. Not currently at goal.  Discussed chronic inflammatory condition causing scarring alopecia.  No cure.     Frontal fibrosing alopecia (FFA) is a chronic/progressive and irreversible patterned form of scarring alopecia that presents as a symmetric band-like zone of hair loss along the anterior hairline. The eyebrows are often thinned or absent. It is considered a variant of lichen planopilaris in a patterned distribution and is more  common in women. Therapies may stop or slow progression but generally do not lead to hair regrowth.  BP 110/61  Start Minoxicil 2.5 MG take 1/2 tablet po QD x 1 mo. Then increase to one tab if no side effects. dsp #30 2Rf. Rx will be written 1 po QD. Continue Tacrolimus 0.1% ointment at bedtime to frontal hair line at scalp. Rfs sent. Continue Clobetasol solution at bedtime to frontal scalp behind hair line M-F, off weekends. Rfs sent. Continue Plaquenil '200mg'$  BID.  Continue Finasteride '5mg'$  1/2 tablet once daily. Rfs sent.   Continue yearly eye exams Labs (CBC/diff, CMP) done by PCP at yearly exam. 05/30/22 labs nl CBC/diff, CMP. Lab ordered for CBC/diff, will have done in May with PCP.         minoxidil (LONITEN) 2.5 MG tablet - Scalp Take 1 tablet (2.5 mg total) by mouth daily.  Related Procedures CBC with Differential/Platelet  Related Medications finasteride (PROSCAR) 5 MG tablet Take 1 tablet (5 mg total) by mouth daily.  clobetasol (TEMOVATE) 0.05 % external solution Apply topically to scalp behind hairline every night. Avoid face.  tacrolimus (PROTOPIC) 0.1 % ointment Apply to affected areas frontal scalp every night as directd.  hydroxychloroquine (PLAQUENIL) 200 MG tablet TAKE 1 TABLET BY MOUTH TWICE A DAY   Return in about 3 months (around 02/14/2023) for FFA.  Documentation: I have reviewed the above documentation for accuracy and completeness, and I agree with the above.  Brendolyn Patty MD

## 2023-01-02 ENCOUNTER — Other Ambulatory Visit: Payer: Self-pay | Admitting: Dermatology

## 2023-01-02 DIAGNOSIS — L661 Lichen planopilaris: Secondary | ICD-10-CM

## 2023-01-28 ENCOUNTER — Other Ambulatory Visit: Payer: Self-pay | Admitting: Dermatology

## 2023-01-28 DIAGNOSIS — L661 Lichen planopilaris: Secondary | ICD-10-CM

## 2023-02-08 ENCOUNTER — Other Ambulatory Visit: Payer: Self-pay | Admitting: Dermatology

## 2023-02-08 DIAGNOSIS — L661 Lichen planopilaris: Secondary | ICD-10-CM

## 2023-02-09 LAB — CBC WITH DIFFERENTIAL/PLATELET
Basophils Absolute: 0.1 10*3/uL (ref 0.0–0.2)
Basos: 1 %
EOS (ABSOLUTE): 0.1 10*3/uL (ref 0.0–0.4)
Eos: 1 %
Hematocrit: 40.3 % (ref 34.0–46.6)
Hemoglobin: 13.5 g/dL (ref 11.1–15.9)
Immature Grans (Abs): 0 10*3/uL (ref 0.0–0.1)
Immature Granulocytes: 0 %
Lymphocytes Absolute: 2.4 10*3/uL (ref 0.7–3.1)
Lymphs: 29 %
MCH: 30.7 pg (ref 26.6–33.0)
MCHC: 33.5 g/dL (ref 31.5–35.7)
MCV: 92 fL (ref 79–97)
Monocytes Absolute: 0.6 10*3/uL (ref 0.1–0.9)
Monocytes: 8 %
Neutrophils Absolute: 5.2 10*3/uL (ref 1.4–7.0)
Neutrophils: 61 %
Platelets: 299 10*3/uL (ref 150–450)
RBC: 4.4 x10E6/uL (ref 3.77–5.28)
RDW: 12 % (ref 11.7–15.4)
WBC: 8.4 10*3/uL (ref 3.4–10.8)

## 2023-02-12 ENCOUNTER — Telehealth: Payer: Self-pay

## 2023-02-12 DIAGNOSIS — L661 Lichen planopilaris: Secondary | ICD-10-CM

## 2023-02-12 MED ORDER — HYDROXYCHLOROQUINE SULFATE 200 MG PO TABS: ORAL_TABLET | ORAL | 5 refills | Status: DC

## 2023-02-12 NOTE — Telephone Encounter (Signed)
Discussed lab results with patient. 

## 2023-02-12 NOTE — Telephone Encounter (Signed)
-----   Message from Willeen Niece, MD sent at 02/12/2023 10:46 AM EDT ----- CBC/diff is normal, continue plaquenil as prescribed, send in rfs if needed   - please call patient

## 2023-02-12 NOTE — Telephone Encounter (Signed)
Left message to call for lab results, [facility]

## 2023-02-26 ENCOUNTER — Ambulatory Visit: Payer: Medicare PPO | Admitting: Dermatology

## 2023-02-26 VITALS — BP 134/73 | HR 72

## 2023-02-26 DIAGNOSIS — L669 Cicatricial alopecia, unspecified: Secondary | ICD-10-CM | POA: Diagnosis not present

## 2023-02-26 DIAGNOSIS — L820 Inflamed seborrheic keratosis: Secondary | ICD-10-CM

## 2023-02-26 DIAGNOSIS — X32XXXA Exposure to sunlight, initial encounter: Secondary | ICD-10-CM

## 2023-02-26 DIAGNOSIS — W908XXA Exposure to other nonionizing radiation, initial encounter: Secondary | ICD-10-CM

## 2023-02-26 DIAGNOSIS — L6612 Frontal fibrosing alopecia: Secondary | ICD-10-CM

## 2023-02-26 DIAGNOSIS — L814 Other melanin hyperpigmentation: Secondary | ICD-10-CM

## 2023-02-26 DIAGNOSIS — L821 Other seborrheic keratosis: Secondary | ICD-10-CM

## 2023-02-26 DIAGNOSIS — D1801 Hemangioma of skin and subcutaneous tissue: Secondary | ICD-10-CM

## 2023-02-26 DIAGNOSIS — L661 Lichen planopilaris: Secondary | ICD-10-CM

## 2023-02-26 DIAGNOSIS — L578 Other skin changes due to chronic exposure to nonionizing radiation: Secondary | ICD-10-CM

## 2023-02-26 MED ORDER — CLOBETASOL PROPIONATE 0.05 % EX SOLN
CUTANEOUS | 3 refills | Status: DC
Start: 2023-02-26 — End: 2024-03-04

## 2023-02-26 MED ORDER — TACROLIMUS 0.1 % EX OINT
TOPICAL_OINTMENT | CUTANEOUS | 1 refills | Status: AC
Start: 2023-02-26 — End: ?

## 2023-02-26 MED ORDER — FINASTERIDE 5 MG PO TABS
5.0000 mg | ORAL_TABLET | Freq: Every day | ORAL | 2 refills | Status: DC
Start: 2023-02-26 — End: 2023-04-12

## 2023-02-26 NOTE — Progress Notes (Signed)
Follow-Up Visit   Subjective  Nichole Sloan is a 77 y.o. female who presents for the following: itchy spot at right dorsal hand that bothers patient and a spot at right lower leg she would like checked today.  Also here for 3 month follow up on FFA. Reports she did not start minoxidil concerned with side effects. Is using finasteride, plaquenil 200 mg once daily, tacrolimus and clobetasol solution to affected areas of scalp. Has not noticed any improvement. Has taken doxy in past without improvement.   The following portions of the chart were reviewed this encounter and updated as appropriate: medications, allergies, medical history  Review of Systems:  No other skin or systemic complaints except as noted in HPI or Assessment and Plan.  Objective  Well appearing patient in no apparent distress; mood and affect are within normal limits.   A focused examination was performed of the following areas: Right leg, right hand, scalp  Relevant exam findings are noted in the Assessment and Plan.  right dorsum hand x 1, right pretibia x 1 (2) Erythematous stuck-on, waxy papule or plaque    Assessment & Plan   Frontal fibrosing alopecia Scalp   Exam:   Increased thinning of the bil temporal and frontal hairline, no erythema. Scarring alopecia with scattered follicular prominence of the frontal hairline. Thinning of the occipital scalp at hairline L > R. Minimal change when compared to baseline photos.  Possible increased thinning on Right temporal scalp.   With component of androgenetic alopecia. Chronic and persistent condition with duration or expected duration over one year. Condition is symptomatic/ bothersome to patient. Not currently at goal.  Discussed chronic inflammatory condition   No cure.     Frontal fibrosing alopecia (FFA) is a chronic/progressive and irreversible patterned form of scarring alopecia that presents as a symmetric band-like zone of hair loss along the anterior  hairline. The eyebrows are often thinned or absent. It is considered a variant of lichen planopilaris in a patterned distribution and is more common in women. Therapies may stop or slow progression but generally do not lead to hair regrowth.   BP 134/73   Did not start oral minoxidil due concerned about side effects.    Recommend minoxidil 5% (Rogaine for men) solution or foam to be applied to the aas scalp and left in. This should ideally be used twice daily for best results but it helps with hair regrowth when used at least three times per week. Rogaine initially can cause increased hair shedding for the first few weeks but this will stop with continued use. In studies, people who used minoxidil (Rogaine) for at least 6 months had thicker hair than people who did not. Minoxidil topical (Rogaine) only works as long as it continues to be used. If if it is no longer used then the hair it has been helping to regrow can fall out. Minoxidil topical (Rogaine) can cause increased facial hair growth which can usually be managed easily with a battery-operated hair trimmer. If facial hair growth is bothersome, switching to the 2% women's version can decrease the risk of unwanted facial hair growth.   Continue Tacrolimus 0.1% ointment at bedtime to frontal hair line at scalp.  Continue Clobetasol solution at bedtime to frontal scalp behind hair line M-F, off weekends.  Continue Plaquenil 200mg  PO once day, pt is under 5 mg/kg daily dose and has been on medication ~2.5 years. Continue Finasteride 5mg , increase to 1 tablet by mouth daily.  Inflamed seborrheic keratosis (2) right dorsum hand x 1, right pretibia x 1  Symptomatic, irritating, patient would like treated.  Destruction of lesion - right dorsum hand x 1, right pretibia x 1  Destruction method: cryotherapy   Informed consent: discussed and consent obtained   Lesion destroyed using liquid nitrogen: Yes   Region frozen until ice ball extended  beyond lesion: Yes   Outcome: patient tolerated procedure well with no complications   Post-procedure details: wound care instructions given   Additional details:  Prior to procedure, discussed risks of blister formation, small wound, skin dyspigmentation, or rare scar following cryotherapy. Recommend Vaseline ointment to treated areas while healing.   Related Medications triamcinolone cream (KENALOG) 0.1 % Apply to itchy spots on back once to twice daily as needed. Avoid face, groin, axilla.  Frontal fibrosing alopecia  Related Medications minoxidil (LONITEN) 2.5 MG tablet Take 1 tablet (2.5 mg total) by mouth daily.  hydroxychloroquine (PLAQUENIL) 200 MG tablet TAKE 1 TABLET DAILY  finasteride (PROSCAR) 5 MG tablet Take 1 tablet (5 mg total) by mouth daily.  clobetasol (TEMOVATE) 0.05 % external solution Apply topically to scalp behind hairline every night. Avoid face.  tacrolimus (PROTOPIC) 0.1 % ointment Apply to affected areas frontal scalp every night as directd.   LENTIGINES Exam: scattered tan macules Due to sun exposure Treatment Plan: Benign-appearing, observe. Recommend daily broad spectrum sunscreen SPF 30+ to sun-exposed areas, reapply every 2 hours as needed.  Call for any changes  SEBORRHEIC KERATOSIS - Stuck-on, waxy, tan-brown papules and/or plaques  - Benign-appearing - Discussed benign etiology and prognosis. - Observe - Call for any changes  HEMANGIOMA Exam: red papule(s) forehead Discussed benign nature. Recommend observation. Call for changes.  ACTINIC DAMAGE - chronic, secondary to cumulative UV radiation exposure/sun exposure over time - diffuse scaly erythematous macules with underlying dyspigmentation - Recommend daily broad spectrum sunscreen SPF 30+ to sun-exposed areas, reapply every 2 hours as needed.  - Recommend staying in the shade or wearing long sleeves, sun glasses (UVA+UVB protection) and wide brim hats (4-inch brim around the  entire circumference of the hat). - Call for new or changing lesions.   Return in about 6 months (around 08/28/2023) for ffa followup.  I, Asher Muir, CMA, am acting as scribe for Willeen Niece, MD.   Documentation: I have reviewed the above documentation for accuracy and completeness, and I agree with the above.  Willeen Niece, MD

## 2023-02-26 NOTE — Patient Instructions (Addendum)
For Hair Growth   Recommend minoxidil 5% (Rogaine for men) or women's solution or foam to be applied to the scalp and left in. This should ideally be used twice daily for best results but it helps with hair regrowth when used at least three times per week. Rogaine initially can cause increased hair shedding for the first few weeks but this will stop with continued use. In studies, people who used minoxidil (Rogaine) for at least 6 months had thicker hair than people who did not. Minoxidil topical (Rogaine) only works as long as it continues to be used. If if it is no longer used then the hair it has been helping to regrow can fall out. Minoxidil topical (Rogaine) can cause increased facial hair growth which can usually be managed easily with a battery-operated hair trimmer. If facial hair growth is bothersome, switching to the 2% women's version can decrease the risk of unwanted facial hair growth.  Continue Tacrolimus 0.1% ointment at bedtime to frontal hair line at scalp. . Continue Clobetasol solution at bedtime to frontal scalp behind hair line M-F, off weekends. Continue Plaquenil 200mg  once daily  Continue Finasteride 5mg  take  Can increase to 1 whole tab by mouth daily  Recommend Yearly Eye exam   Topical steroids (such as triamcinolone, fluocinolone, fluocinonide, mometasone, clobetasol, halobetasol, betamethasone, hydrocortisone) can cause thinning and lightening of the skin if they are used for too long in the same area. Your physician has selected the right strength medicine for your problem and area affected on the body. Please use your medication only as directed by your physician to prevent side effects.     Counseling for BBL / IPL / Laser and Coordination of Care Discussed the treatment option of Broad Band Light (BBL) /Intense Pulsed Light (IPL)/ Laser for skin discoloration, including brown spots and redness.  Typically we recommend at least 1-3 treatment sessions about 5-8 weeks  apart for best results.  Cannot have tanned skin when BBL performed, and regular use of sunscreen is advised after the procedure to help maintain results. The patient's condition may also require "maintenance treatments" in the future.  The fee for BBL / laser treatments is $350 per treatment session for the whole face.  A fee can be quoted for other parts of the body.  Insurance typically does not pay for BBL/laser treatments and therefore the fee is an out-of-pocket cost.      Cryotherapy Aftercare  Wash gently with soap and water everyday.   Apply Vaseline and Band-Aid daily until healed.    Seborrheic Keratosis  What causes seborrheic keratoses? Seborrheic keratoses are harmless, common skin growths that first appear during adult life.  As time goes by, more growths appear.  Some people may develop a large number of them.  Seborrheic keratoses appear on both covered and uncovered body parts.  They are not caused by sunlight.  The tendency to develop seborrheic keratoses can be inherited.  They vary in color from skin-colored to gray, brown, or even black.  They can be either smooth or have a rough, warty surface.   Seborrheic keratoses are superficial and look as if they were stuck on the skin.  Under the microscope this type of keratosis looks like layers upon layers of skin.  That is why at times the top layer may seem to fall off, but the rest of the growth remains and re-grows.    Treatment Seborrheic keratoses do not need to be treated, but can easily be removed  in the office.  Seborrheic keratoses often cause symptoms when they rub on clothing or jewelry.  Lesions can be in the way of shaving.  If they become inflamed, they can cause itching, soreness, or burning.  Removal of a seborrheic keratosis can be accomplished by freezing, burning, or surgery. If any spot bleeds, scabs, or grows rapidly, please return to have it checked, as these can be an indication of a skin  cancer.      Due to recent changes in healthcare laws, you may see results of your pathology and/or laboratory studies on MyChart before the doctors have had a chance to review them. We understand that in some cases there may be results that are confusing or concerning to you. Please understand that not all results are received at the same time and often the doctors may need to interpret multiple results in order to provide you with the best plan of care or course of treatment. Therefore, we ask that you please give [place] 2 business days to thoroughly review all your results before contacting the office for clarification. Should we see a critical lab result, you will be contacted sooner.   If You Need Anything After Your Visit  If you have any questions or concerns for your doctor, please call our main line at [phone] and press option 4 to reach your doctor's medical assistant. If no one answers, please leave a voicemail as directed and we will return your call as soon as possible. Messages left after 4 pm will be answered the following business day.   You may also send [place] a message via MyChart. We typically respond to MyChart messages within 1-2 business days.  For prescription refills, please ask your pharmacy to contact our office. Our fax number is [phone].  If you have an urgent issue when the clinic is closed that cannot wait until the next business day, you can page your doctor at the number below.    Please note that while we do our best to be available for urgent issues outside of office hours, we are not available 24/7.   If you have an urgent issue and are unable to reach [place], you may choose to seek medical care at your doctor's office, retail clinic, urgent care center, or emergency room.  If you have a medical emergency, please immediately call 911 or go to the emergency department.  Pager Numbers  - Dr. Gwen Pounds: [phone]  - Dr. Neale Burly: [phone]  - Dr.  Roseanne Reno: [phone]  In the event of inclement weather, please call our main line at [phone] for an update on the status of any delays or closures.  Dermatology Medication Tips: Please keep the boxes that topical medications come in in order to help keep track of the instructions about where and how to use these. Pharmacies typically print the medication instructions only on the boxes and not directly on the medication tubes.   If your medication is too expensive, please contact our office at [phone] option 4 or send [place] a message through MyChart.   We are unable to tell what your co-pay for medications will be in advance as this is different depending on your insurance coverage. However, we may be able to find a substitute medication at lower cost or fill out paperwork to get insurance to cover a needed medication.   If a prior authorization is required to get your medication covered by your insurance company, please allow [place] 1-2 business days to complete this  process.  Drug prices often vary depending on where the prescription is filled and some pharmacies may offer cheaper prices.  The website www.goodrx.com contains coupons for medications through different pharmacies. The prices here do not account for what the cost may be with help from insurance (it may be cheaper with your insurance), but the website can give you the price if you did not use any insurance.  - You can print the associated coupon and take it with your prescription to the pharmacy.  - You may also stop by our office during regular business hours and pick up a GoodRx coupon card.  - If you need your prescription sent electronically to a different pharmacy, notify our office through [facility] or by phone at [phone] option 4.     Si Usted Necesita Algo Despus de Su Visita  Tambin puede enviarnos un mensaje a travs de Clinical cytogeneticist. Por lo general respondemos a los mensajes de MyChart en el transcurso  de 1 a 2 das hbiles.  Para renovar recetas, por favor pida a su farmacia que se ponga en contacto con nuestra oficina. Annie Sable de fax es [place] [phone].  Si tiene un asunto urgente cuando la clnica est cerrada y que no puede esperar hasta el siguiente da hbil, puede llamar/localizar a su doctor(a) al nmero que aparece a continuacin.   Por favor, tenga en cuenta que aunque hacemos todo lo posible para estar disponibles para asuntos urgentes fuera del horario de [place], no estamos disponibles las 24 horas del da, los 7 [address]  [address] de la [place].   Si tiene un problema urgente y no puede comunicarse con nosotros, puede optar por buscar atencin mdica  en el consultorio de su doctor(a), en una clnica privada, en un centro de atencin urgente o en una sala de emergencias.  Si tiene Engineer, drilling, por favor llame inmediatamente al 911 o vaya a la sala de emergencias.  Nmeros de bper  - Dr. Gwen Pounds: [phone]  - Dra. Moye: [phone]  - Dra. Roseanne Reno: [phone]  En caso de inclemencias del [place], por favor llame a Lacy Duverney principal al [phone] para una actualizacin sobre el [place] de cualquier retraso o cierre.  Consejos para la medicacin en dermatologa: Por favor, guarde las cajas en las que vienen los medicamentos de uso tpico para ayudarle a seguir las instrucciones sobre dnde y cmo usarlos. Las farmacias generalmente imprimen las instrucciones del medicamento slo en las cajas y no directamente en los tubos del [place].   Si su medicamento es muy caro, por favor, pngase en contacto con Rolm Gala llamando al [phone] y presione la opcin 4 o envenos un mensaje a travs de Clinical cytogeneticist.   No podemos decirle cul ser su copago por los medicamentos por adelantado ya que esto es diferente dependiendo de la cobertura de su seguro. Sin embargo, es posible que podamos encontrar un medicamento sustituto a Audiological scientist un formulario  para que el seguro cubra el medicamento que se considera necesario.   Si se requiere una autorizacin previa para que su compaa de seguros [place] su medicamento, por favor permtanos de 1 a 2 das hbiles para completar [address]  Los precios de los medicamentos varan con frecuencia dependiendo del Environmental consultant de dnde se surte la receta y alguna farmacias pueden ofrecer precios ms baratos.  El sitio web www.goodrx.com tiene cupones para medicamentos de Health and safety inspector. Los precios aqu no tienen en cuenta lo que podra costar con la ayuda del seguro (puede  ser ms barato con su seguro), pero el sitio web puede darle el precio si no Field seismologist.  - Puede imprimir el cupn correspondiente y llevarlo con su receta a la farmacia.  - Tambin puede pasar por nuestra oficina durante el horario de atencin regular y Charity fundraiser una tarjeta de cupones de GoodRx.  - Si necesita que su receta se enve electrnicamente a una farmacia diferente, informe a nuestra oficina a travs de MyChart de [place] o por telfono llamando al [phone] y presione la opcin 4.

## 2023-04-12 ENCOUNTER — Other Ambulatory Visit: Payer: Self-pay | Admitting: Dermatology

## 2023-04-12 DIAGNOSIS — L661 Lichen planopilaris: Secondary | ICD-10-CM

## 2023-05-16 ENCOUNTER — Other Ambulatory Visit: Payer: Self-pay | Admitting: Dermatology

## 2023-05-16 DIAGNOSIS — L661 Lichen planopilaris: Secondary | ICD-10-CM

## 2023-06-01 ENCOUNTER — Other Ambulatory Visit: Payer: Self-pay | Admitting: Family Medicine

## 2023-06-01 DIAGNOSIS — N6341 Unspecified lump in right breast, subareolar: Secondary | ICD-10-CM

## 2023-06-12 ENCOUNTER — Ambulatory Visit
Admission: RE | Admit: 2023-06-12 | Discharge: 2023-06-12 | Disposition: A | Discharge status: Home / Self Care | Payer: Medicare PPO | Source: Ambulatory Visit | Attending: Family Medicine | Admitting: Family Medicine

## 2023-06-12 DIAGNOSIS — N6341 Unspecified lump in right breast, subareolar: Secondary | ICD-10-CM | POA: Diagnosis present

## 2023-07-20 IMAGING — MG MM DIGITAL SCREENING BILAT W/ TOMO AND CAD
6 of 10 series · 6 of 30 positions shown · non-contrast
Comparison: Previous exam(s).

CLINICAL DATA: Screening.

EXAM:
DIGITAL SCREENING BILATERAL MAMMOGRAM WITH TOMOSYNTHESIS AND CAD
TECHNIQUE: Bilateral screening digital craniocaudal and mediolateral oblique
mammograms were obtained. Bilateral screening digital breast
tomosynthesis was performed. The images were evaluated with
computer-aided detection.

[R MLO synth-2D]
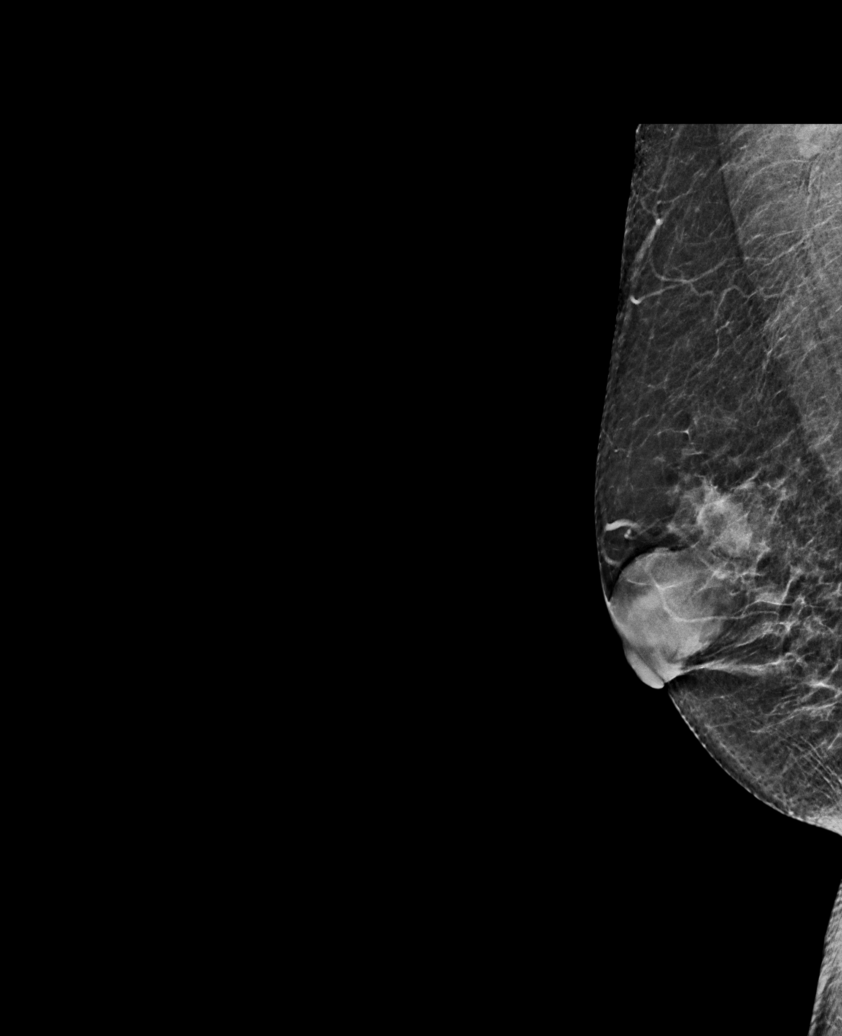

[L CC synth-2D (1 of 2)]
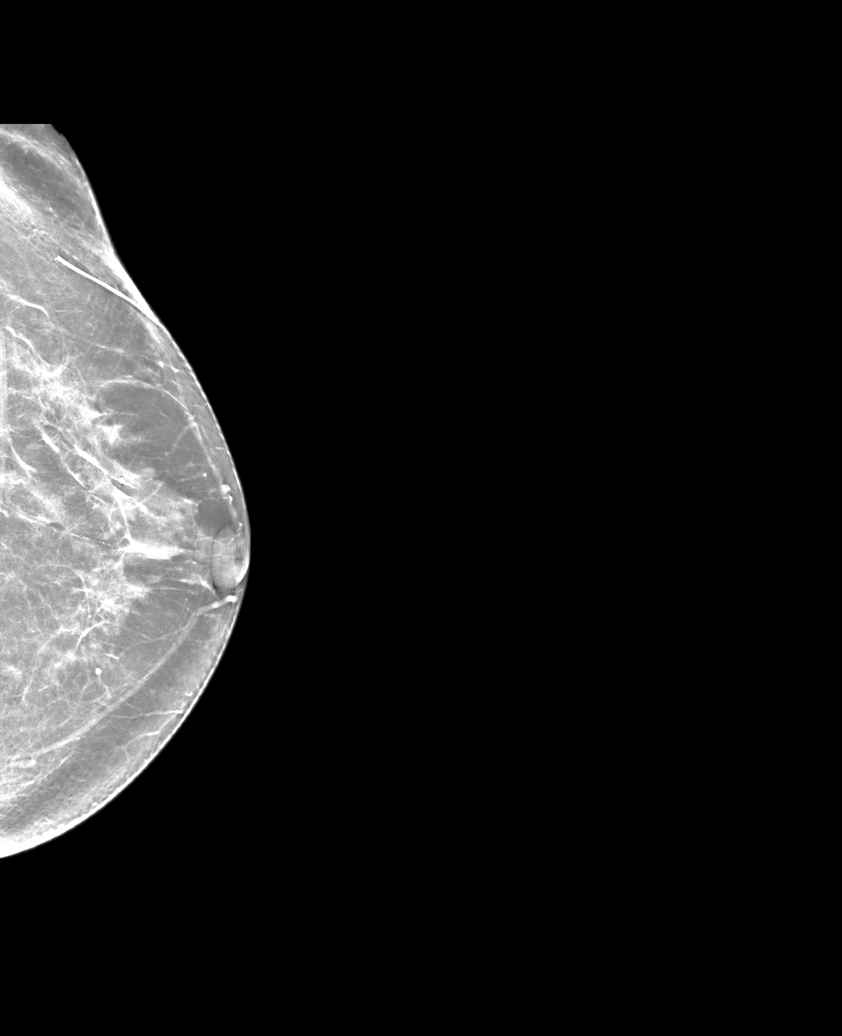

[L CC synth-2D (2 of 2)]
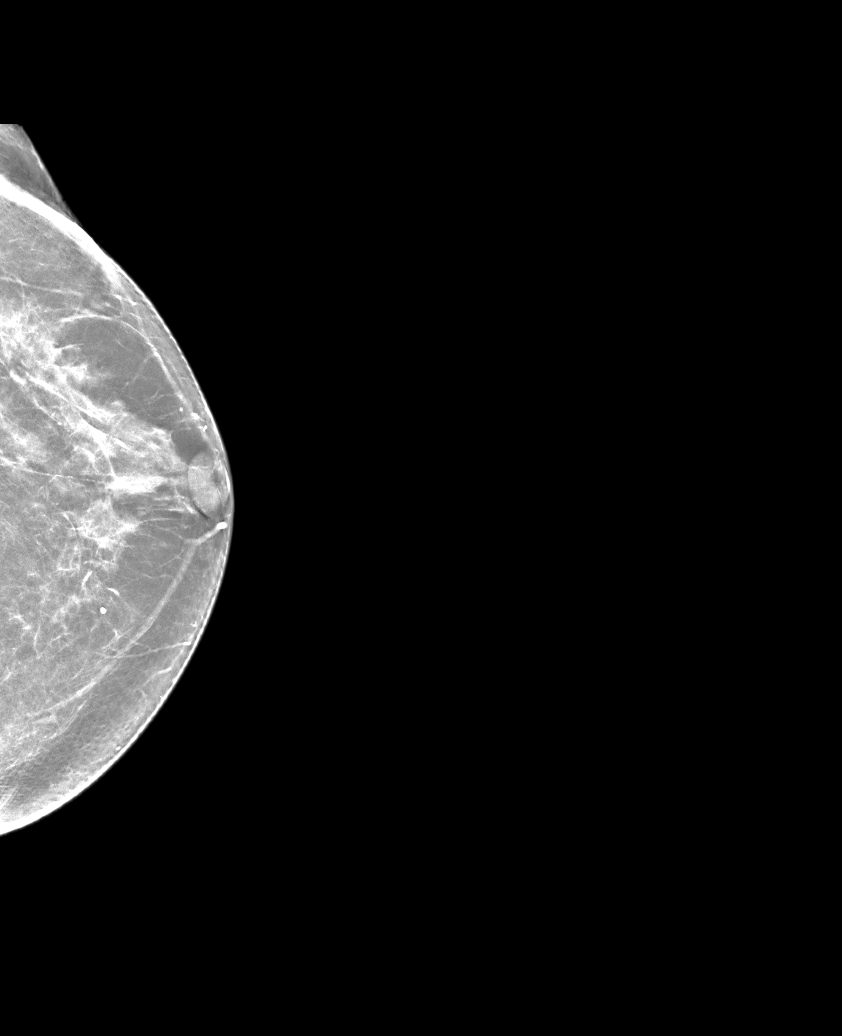

[L MLO synth-2D]
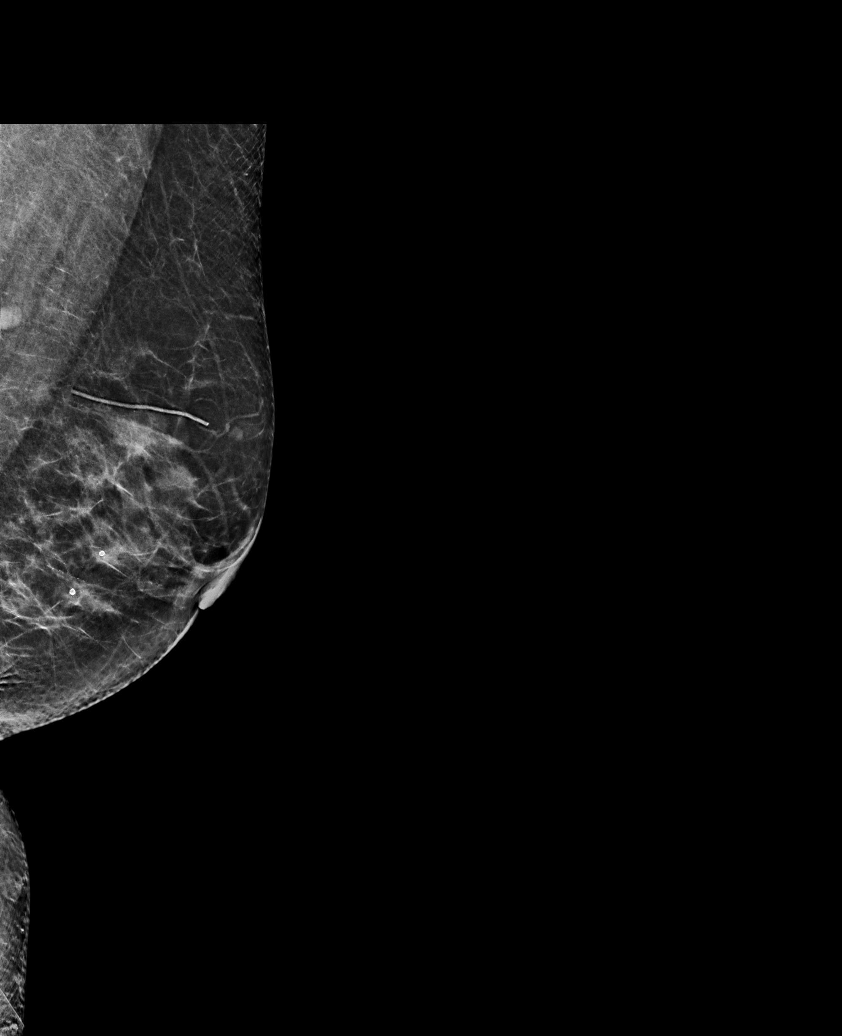

[R CC synth-2D]
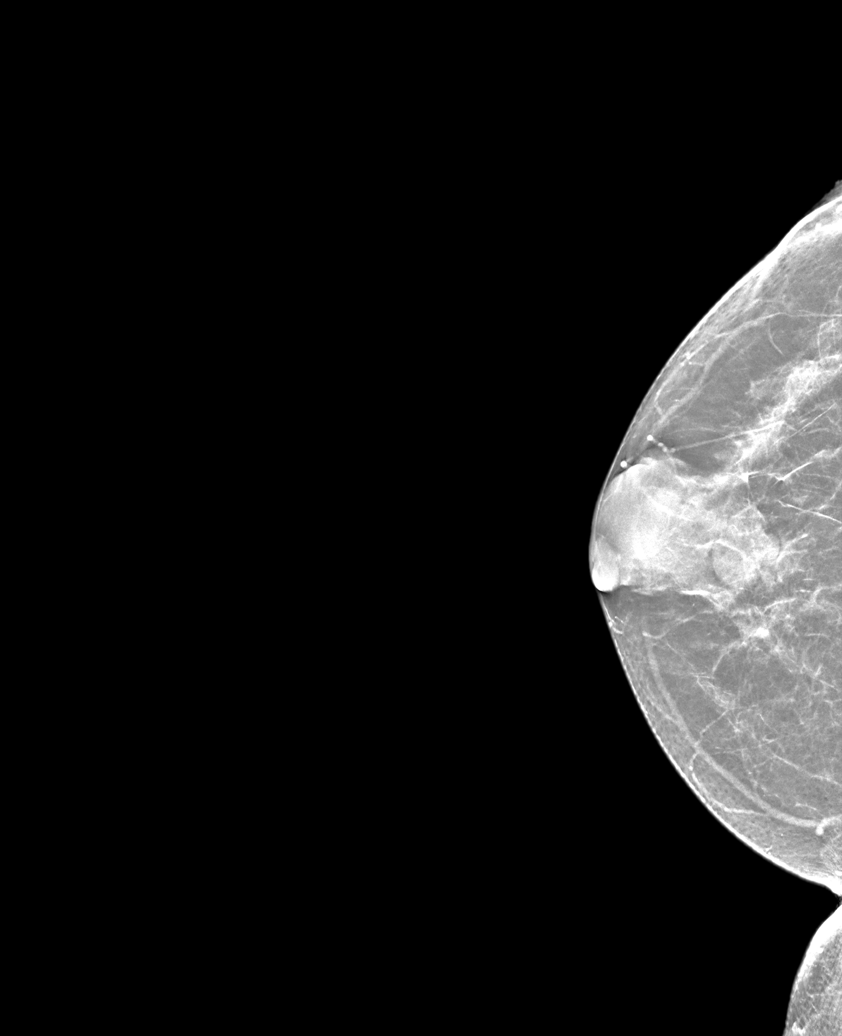

[R MLO tomo · tomo slice 28/55.0]
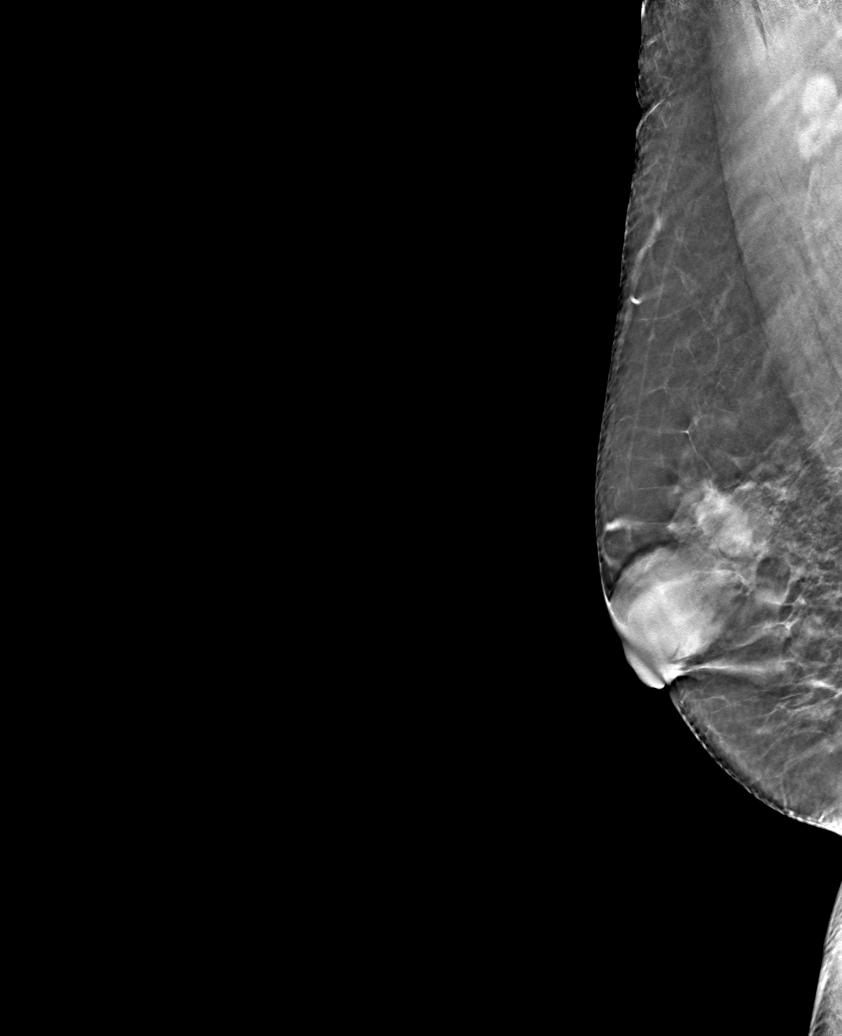

[6 of 30 positions shown; findings below may reference images not displayed]

ACR Breast Density Category b: There are scattered areas of
fibroglandular density.
FINDINGS: There are no findings suspicious for malignancy.
IMPRESSION: No mammographic evidence of malignancy. A result letter of this
screening mammogram will be mailed directly to the patient.

RECOMMENDATION:
Screening mammogram in one year. (Code:51-O-LD2)

BI-RADS CATEGORY  1: Negative.

## 2023-07-25 ENCOUNTER — Other Ambulatory Visit: Payer: Self-pay | Admitting: General Surgery

## 2023-07-25 DIAGNOSIS — N6001 Solitary cyst of right breast: Secondary | ICD-10-CM

## 2023-07-26 ENCOUNTER — Other Ambulatory Visit: Payer: Self-pay | Admitting: Dermatology

## 2023-07-26 DIAGNOSIS — L719 Rosacea, unspecified: Secondary | ICD-10-CM

## 2023-07-30 ENCOUNTER — Other Ambulatory Visit: Payer: Self-pay | Admitting: General Surgery

## 2023-07-30 DIAGNOSIS — N6001 Solitary cyst of right breast: Secondary | ICD-10-CM

## 2023-08-02 ENCOUNTER — Ambulatory Visit
Admission: RE | Admit: 2023-08-02 | Discharge: 2023-08-02 | Disposition: A | Discharge status: Home / Self Care | Payer: Medicare PPO | Source: Ambulatory Visit | Attending: General Surgery | Admitting: General Surgery

## 2023-08-02 DIAGNOSIS — N6001 Solitary cyst of right breast: Secondary | ICD-10-CM | POA: Insufficient documentation

## 2023-08-02 MED ORDER — LIDOCAINE 1 % OPTIME INJ - NO CHARGE
5.0000 mL | Freq: Once | INTRAMUSCULAR | Status: AC
  Administered 2023-08-02: 5 mL
  Filled 2023-08-02: qty 6

## 2023-10-08 ENCOUNTER — Ambulatory Visit: Payer: Medicare PPO | Admitting: Dermatology

## 2023-10-24 ENCOUNTER — Ambulatory Visit: Payer: Medicare PPO | Admitting: Dermatology

## 2023-10-24 DIAGNOSIS — L6612 Frontal fibrosing alopecia: Secondary | ICD-10-CM

## 2023-10-24 DIAGNOSIS — Z79899 Other long term (current) drug therapy: Secondary | ICD-10-CM | POA: Diagnosis not present

## 2023-10-24 DIAGNOSIS — L300 Nummular dermatitis: Secondary | ICD-10-CM

## 2023-10-24 MED ORDER — KETOCONAZOLE 2 % EX SHAM: MEDICATED_SHAMPOO | CUTANEOUS | 5 refills | Status: AC

## 2023-10-24 MED ORDER — MINOXIDIL 2.5 MG PO TABS: 2.5000 mg | ORAL_TABLET | Freq: Every day | ORAL | 5 refills | Status: DC

## 2023-10-24 MED ORDER — HYDROXYCHLOROQUINE SULFATE 200 MG PO TABS: ORAL_TABLET | ORAL | 5 refills | Status: DC

## 2023-10-24 MED ORDER — TRIAMCINOLONE ACETONIDE 10 MG/ML IJ SUSP
10.0000 mg | Freq: Once | INTRAMUSCULAR | Status: AC
Start: 2023-10-24 — End: 2023-10-24
  Administered 2023-10-24: 10 mg

## 2023-10-24 NOTE — Progress Notes (Signed)
Follow-Up Visit   Subjective  Nichole Sloan is a 78 y.o. female who presents for the following: 6 month follow up on FFA, stable and no worsening, no itching. She is taking Plaquenil 200mg  once daily and uses tacrolimus ointment at bedtime to frontal hair line and clobetasol solution at bedtime to frontal scalp behind hair line M-F. She stopped taking finasteride, as she didn't see any improvement and she got a breast cyst that worried her.  She also has an itchy area on her right lower leg x 4-5 days, not improving with triamcinolone cream. She has a growth on the left flank that is bothersome, would like treated.    The patient has spots, moles and lesions to be evaluated, some may be new or changing.    The following portions of the chart were reviewed this encounter and updated as appropriate: medications, allergies, medical history  Review of Systems:  No other skin or systemic complaints except as noted in HPI or Assessment and Plan.  Objective  Well appearing patient in no apparent distress; mood and affect are within normal limits.  A focused examination was performed of the following areas: Face, scalp, right lower leg  Relevant physical exam findings are noted in the Assessment and Plan.  Scalp  Increased thinning of the bil temporal and frontal hairline, no erythema. Scarring alopecia with scattered follicular prominence of the frontal hairline. Thinning of the occipital scalp at hairline L > R. Some worsening when compared to baseline photos.         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 Condition is symptomatic/ bothersome to patient. Not currently at goal.  Discussed chronic inflammatory condition   No cure.     Frontal fibrosing alopecia (FFA) is a chronic/progressive and irreversible patterned form  of scarring alopecia that presents as a symmetric band-like zone of hair loss along the anterior hairline. The eyebrows are often thinned or absent. It is considered a variant of lichen planopilaris in a patterned distribution and is more common in women. Therapies may stop or slow progression but generally do not lead to hair regrowth.  Photos compared from 10/05/2020. New photos taken today.  CMP and CBC reviewed from 08/30/23. Continue yearly eye exams.   Treatment: Continue Tacrolimus 0.1% ointment at bedtime to frontal/temporal hair line at scalp.  Continue Clobetasol solution at bedtime to frontal scalp behind hair line M-F, off weekends.  Continue Plaquenil 200mg  PO once day, pt is under 5 mg/kg daily dose and has been on medication ~3 years. Start Ketoconazole 2% shampoo - massage into scalp and let sit several minutes before rinsing dsp 120 mL 5Rf. Start Minoxidil 2.5 mg take 1/2 tablet daily x 1 month. If tolerating ok, may increase to full tablet dsp #30 5Rf.  Topical steroids (such as triamcinolone, fluocinolone, fluocinonide, mometasone, clobetasol, halobetasol, betamethasone, hydrocortisone) can cause thinning and lightening of the skin if they are used for too long in the same area. Your physician has selected the right strength medicine for your problem and area affected on the body. Please use your medication only as directed by your physician to prevent side effects.   Doses of oral minoxidil for hair loss are considered 'low dose'. This is because the doses used for hair loss are much lower than the doses which are used for conditions such as high blood  pressure (hypertension). The doses used for hypertension are 10-40mg  per day.  Side effects are uncommon at the low doses (up to 2.5 mg/day) used to treat hair loss. Potential side effects, more commonly seen at higher doses, include: Increase in hair growth (hypertrichosis) elsewhere on face and body Temporary hair shedding upon  starting medication which may last up to 4 weeks Ankle swelling, fluid retention, rapid weight gain more than 5 pounds Low blood pressure and feeling lightheaded or dizzy when standing up quickly Fast or irregular heartbeat Headaches  Hydroxychloroquine (plaquenil) may cause sun sensitivity, so daily sun protection is recommended. It can rarely cause irreversible damage to the retina of the eye if used for a long period of time. This damage can be identified and the medication stopped before vision changes are noticed by going for a yearly ophthalmology exam.  Yearly eye exams are recommended if there are underlying risk factors for eye problems (i.e.diabetes), when higher dosage of medication per weight is used (>5mg /kg), and length of time on medication (>5 years). Rarely, this medicine can cause low blood counts, liver inflammation, low blood sugar, and/or a color change of the skin.  It should not be used together with certain medicines that can affect electrical conduction of the heart including macrolide antibiotics such as Azithromycin.  The use of Plaquenil requires long term medication management, including periodic office visits and monitoring of blood work.   Long term medication management.  Patient is using long term (months to years) prescription medication  to control their dermatologic condition.  These medications require periodic monitoring to evaluate for efficacy and side effects and may require periodic laboratory monitoring.   hydroxychloroquine (PLAQUENIL) 200 MG tablet - Scalp TAKE 1 TABLET BY MOUTH QD  minoxidil (LONITEN) 2.5 MG tablet - Scalp Take 1 tablet (2.5 mg total) by mouth daily.  Intralesional injection - Scalp Location: scalp (frontal and temporal scalp hair line)  Informed Consent: Discussed risks (infection, pain, bleeding, bruising, thinning of the skin, loss of skin pigment, lack of resolution, and recurrence of lesion) and benefits of the procedure, as well  as the alternatives. Informed consent was obtained. Preparation: The area was prepared a standard fashion.  Procedure Details: An intralesional injection was performed with Kenalog 3 mg/cc. 3 cc in total were injected.  Total number of injections: >7  Plan: The patient was instructed on post-op care. Recommend OTC analgesia as needed for pain.  Lot [id number] Exp Apr 2026 [facility] 4540-9811-91  Related Medications clobetasol (TEMOVATE) 0.05 % external solution Apply topically to scalp behind hairline every night. Avoid face. tacrolimus (PROTOPIC) 0.1 % ointment Apply to affected areas frontal scalp every night as directd. finasteride (PROSCAR) 5 MG tablet TAKE 1 TABLET (5 MG TOTAL) BY MOUTH DAILY. triamcinolone acetonide (KENALOG) 10 MG/ML injection 10 mg   Nummular Dermatitis  Exam: Pink patch at right pretibia.  Treatment Plan: Continue triamcinolone 0.1% cream BID x 2 weeks, then switch to tacrolimus ointment every day until rash clear  Topical steroids (such as triamcinolone, fluocinolone, fluocinonide, mometasone, clobetasol, halobetasol, betamethasone, hydrocortisone) can cause thinning and lightening of the skin if they are used for too long in the same area. Your physician has selected the right strength medicine for your problem and area affected on the body. Please use your medication only as directed by your physician to prevent side effects.    Return in about 3 months (around 01/21/2024) for FFA f/u with possible IL injections.  Wendee Beavers, CMA, am acting as  scribe for Willeen Niece, MD .   Documentation: I have reviewed the above documentation for accuracy and completeness, and I agree with the above.  Willeen Niece, MD

## 2023-10-24 NOTE — Patient Instructions (Addendum)
Start minoxidil 2.5 mg - take 1/2 tablet by mouth daily. If tolerating ok, may increase to full tablet daily.  Doses of oral minoxidil for hair loss are considered 'low dose'. This is because the doses used for hair loss are much lower than the doses which are used for conditions such as high blood pressure (hypertension). The doses used for hypertension are 10-40mg  per day.  Side effects are uncommon at the low doses (up to 2.5 mg/day) used to treat hair loss. Potential side effects, more commonly seen at higher doses, include: Increase in hair growth (hypertrichosis) elsewhere on face and body Temporary hair shedding upon starting medication which may last up to 4 weeks Ankle swelling, fluid retention, rapid weight gain more than 5 pounds Low blood pressure and feeling lightheaded or dizzy when standing up quickly Fast or irregular heartbeat Headaches  Start ketoconazole 2% shampoo - massage into scalp 1-2 times a week, let sit several minutes before rinsing.    Due to recent changes in healthcare laws, you may see results of your pathology and/or laboratory studies on MyChart before the doctors have had a chance to review them. We understand that in some cases there may be results that are confusing or concerning to you. Please understand that not all results are received at the same time and often the doctors may need to interpret multiple results in order to provide you with the best plan of care or course of treatment. Therefore, we ask that you please give [place] 2 business days to thoroughly review all your results before contacting the office for clarification. Should we see a critical lab result, you will be contacted sooner.   If You Need Anything After Your Visit  If you have any questions or concerns for your doctor, please call our main line at [phone] and press option 4 to reach your doctor's medical assistant. If no one answers, please leave a voicemail as directed and we will  return your call as soon as possible. Messages left after 4 pm will be answered the following business day.   You may also send [place] a message via MyChart. We typically respond to MyChart messages within 1-2 business days.  For prescription refills, please ask your pharmacy to contact our office. Our fax number is [phone].  If you have an urgent issue when the clinic is closed that cannot wait until the next business day, you can page your doctor at the number below.    Please note that while we do our best to be available for urgent issues outside of office hours, we are not available 24/7.   If you have an urgent issue and are unable to reach [place], you may choose to seek medical care at your doctor's office, retail clinic, urgent care center, or emergency room.  If you have a medical emergency, please immediately call 911 or go to the emergency department.  Pager Numbers  - Dr. Gwen Pounds: [phone]  - Dr. Roseanne Reno: [phone]  - Dr. Katrinka Blazing: [phone]   In the event of inclement weather, please call our main line at [phone] for an update on the status of any delays or closures.  Dermatology Medication Tips: Please keep the boxes that topical medications come in in order to help keep track of the instructions about where and how to use these. Pharmacies typically print the medication instructions only on the boxes and not directly on the medication tubes.   If your medication is too expensive, please contact our  office at [phone] option 4 or send [place] a message through MyChart.   We are unable to tell what your co-pay for medications will be in advance as this is different depending on your insurance coverage. However, we may be able to find a substitute medication at lower cost or fill out paperwork to get insurance to cover a needed medication.   If a prior authorization is required to get your medication covered by your insurance company, please allow [place] 1-2 business  days to complete this process.  Drug prices often vary depending on where the prescription is filled and some pharmacies may offer cheaper prices.  The website www.goodrx.com contains coupons for medications through different pharmacies. The prices here do not account for what the cost may be with help from insurance (it may be cheaper with your insurance), but the website can give you the price if you did not use any insurance.  - You can print the associated coupon and take it with your prescription to the pharmacy.  - You may also stop by our office during regular business hours and pick up a GoodRx coupon card.  - If you need your prescription sent electronically to a different pharmacy, notify our office through [facility] or by phone at [phone] option 4.     Si Usted Necesita Algo Despus de Su Visita  Tambin puede enviarnos un mensaje a travs de Clinical cytogeneticist. Por lo general respondemos a los mensajes de MyChart en el transcurso de 1 a 2 das hbiles.  Para renovar recetas, por favor pida a su farmacia que se ponga en contacto con nuestra oficina. Annie Sable de fax es [place] [phone].  Si tiene un asunto urgente cuando la clnica est cerrada y que no puede esperar hasta el siguiente da hbil, puede llamar/localizar a su doctor(a) al nmero que aparece a continuacin.   Por favor, tenga en cuenta que aunque hacemos todo lo posible para estar disponibles para asuntos urgentes fuera del horario de [place], no estamos disponibles las 24 horas del da, los 7 [address]  [address] de la [place].   Si tiene un problema urgente y no puede comunicarse con nosotros, puede optar por buscar atencin mdica  en el consultorio de su doctor(a), en una clnica privada, en un centro de atencin urgente o en una sala de emergencias.  Si tiene Engineer, drilling, por favor llame inmediatamente al 911 o vaya a la sala de emergencias.  Nmeros de bper  - Dr. Gwen Pounds: [phone]  - Dra. Roseanne Reno:  [phone]  - Dr. Katrinka Blazing: [phone]   En caso de inclemencias del tiempo, por favor llame a Lacy Duverney principal al [phone] para una actualizacin sobre el [place] de cualquier retraso o cierre.  Consejos para la medicacin en dermatologa: Por favor, guarde las cajas en las que vienen los medicamentos de uso tpico para ayudarle a seguir las instrucciones sobre dnde y cmo usarlos. Las farmacias generalmente imprimen las instrucciones del medicamento slo en las cajas y no directamente en los tubos del [place].   Si su medicamento es muy caro, por favor, pngase en contacto con Rolm Gala llamando al [phone] y presione la opcin 4 o envenos un mensaje a travs de Clinical cytogeneticist.   No podemos decirle cul ser su copago por los medicamentos por adelantado ya que esto es diferente dependiendo de la cobertura de su seguro. Sin embargo, es posible que podamos encontrar un medicamento sustituto a Audiological scientist un formulario para que el seguro cubra el medicamento  que se considera necesario.   Si se requiere una autorizacin previa para que su compaa de seguros [place] su medicamento, por favor permtanos de 1 a 2 das hbiles para completar [address]  Los precios de los medicamentos varan con frecuencia dependiendo del Environmental consultant de dnde se surte la receta y alguna farmacias pueden ofrecer precios ms baratos.  El sitio web www.goodrx.com tiene cupones para medicamentos de Health and safety inspector. Los precios aqu no tienen en cuenta lo que podra costar con la ayuda del seguro (puede ser ms barato con su seguro), pero el sitio web puede darle el precio si no utiliz Tourist information centre manager.  - Puede imprimir el cupn correspondiente y llevarlo con su receta a la farmacia.  - Tambin puede pasar por nuestra oficina durante el horario de atencin regular y Education officer, museum una tarjeta de cupones de GoodRx.  - Si necesita que su receta se enve electrnicamente a una farmacia diferente, informe  a nuestra oficina a travs de MyChart de [place] o por telfono llamando al [phone] y presione la opcin 4.

## 2023-10-25 ENCOUNTER — Telehealth: Payer: Self-pay

## 2023-10-25 NOTE — Telephone Encounter (Signed)
Spoke to patient about an itchy spot she had mentioned to CMA at yesterdays appointment.  Advised pt we could have her come back in for Dr. Roseanne Reno to check or we could check at her 3 month follow up.  Patient said she was fine with addressing at 3 month follow up if still itchy and if worsened she would call for an earlier appointment.

## 2024-03-04 ENCOUNTER — Ambulatory Visit: Payer: Medicare PPO | Admitting: Dermatology

## 2024-03-04 ENCOUNTER — Encounter: Payer: Self-pay | Admitting: Dermatology

## 2024-03-04 DIAGNOSIS — L6612 Frontal fibrosing alopecia: Secondary | ICD-10-CM | POA: Diagnosis not present

## 2024-03-04 DIAGNOSIS — Z79899 Other long term (current) drug therapy: Secondary | ICD-10-CM | POA: Diagnosis not present

## 2024-03-04 DIAGNOSIS — L821 Other seborrheic keratosis: Secondary | ICD-10-CM

## 2024-03-04 DIAGNOSIS — Z7189 Other specified counseling: Secondary | ICD-10-CM

## 2024-03-04 DIAGNOSIS — L811 Chloasma: Secondary | ICD-10-CM

## 2024-03-04 MED ORDER — TRETINOIN 0.1 % EX CREA: TOPICAL_CREAM | Freq: Every day | CUTANEOUS | 2 refills | Status: AC

## 2024-03-04 MED ORDER — CLOBETASOL PROPIONATE 0.05 % EX SOLN
CUTANEOUS | 3 refills | Status: AC
Start: 2024-03-04 — End: ?

## 2024-03-04 MED ORDER — HYDROXYCHLOROQUINE SULFATE 200 MG PO TABS: ORAL_TABLET | ORAL | 5 refills | Status: AC

## 2024-03-04 NOTE — Patient Instructions (Addendum)
 Start SM20 Hydroquinone  8% / Tretinoin 0.1% / Kojic Acid 1% / Niacinamide 4% / Fluocinolone 0.025% Cream nightly for 3 months to neck, prescription sent to [facility]  Instructions for [facility] Medications  One or more of your medications was sent to the [facility] mail order compounding pharmacy. You will receive an email from them and can purchase the medicine through that link. It will then be mailed to your home at the address you confirmed. If for any reason you do not receive an email from them, please check your spam folder. If you still do not find the email, please let us  know. [facility] phone number is [phone].     Due to recent changes in healthcare laws, you may see results of your pathology and/or laboratory studies on MyChart before the doctors have had a chance to review them. We understand that in some cases there may be results that are confusing or concerning to you. Please understand that not all results are received at the same time and often the doctors may need to interpret multiple results in order to provide you with the best plan of care or course of treatment. Therefore, we ask that you please give us  2 business days to thoroughly review all your results before contacting the office for clarification. Should we see a critical lab result, you will be contacted sooner.   If You Need Anything After Your Visit  If you have any questions or concerns for your doctor, please call our main line at [phone] and press option 4 to reach your doctor's medical assistant. If no one answers, please leave a voicemail as directed and we will return your call as soon as possible. Messages left after 4 pm will be answered the following business day.   You may also send us  a message via MyChart. We typically respond to MyChart messages within 1-2 business days.  For prescription refills, please ask your pharmacy to contact our office. Our fax number is  [phone].  If you have an urgent issue when the clinic is closed that cannot wait until the next business day, you can page your doctor at the number below.    Please note that while we do our best to be available for urgent issues outside of office hours, we are not available 24/7.   If you have an urgent issue and are unable to reach us , you may choose to seek medical care at your doctor's office, retail clinic, urgent care center, or emergency room.  If you have a medical emergency, please immediately call 911 or go to the emergency department.  Pager Numbers  - Dr. Hester: [phone]  - Dr. Jackquline: [phone]  - Dr. Claudene: [phone]   In the event of inclement weather, please call our main line at [phone] for an update on the status of any delays or closures.  Dermatology Medication Tips: Please keep the boxes that topical medications come in in order to help keep track of the instructions about where and how to use these. Pharmacies typically print the medication instructions only on the boxes and not directly on the medication tubes.   If your medication is too expensive, please contact our office at [phone] option 4 or send us  a message through MyChart.   We are unable to tell what your co-pay for medications will be in advance as this is different depending on your insurance coverage. However, we may be able to find a substitute medication at lower  cost or fill out paperwork to get insurance to cover a needed medication.   If a prior authorization is required to get your medication covered by your insurance company, please allow us  1-2 business days to complete this process.  Drug prices often vary depending on where the prescription is filled and some pharmacies may offer cheaper prices.  The website www.goodrx.com contains coupons for medications through different pharmacies. The prices here do not account for what the cost may be with help from  insurance (it may be cheaper with your insurance), but the website can give you the price if you did not use any insurance.  - You can print the associated coupon and take it with your prescription to the pharmacy.  - You may also stop by our office during regular business hours and pick up a GoodRx coupon card.  - If you need your prescription sent electronically to a different pharmacy, notify our office through [facility] or by phone at [phone] option 4.     Si Usted Necesita Algo Despus de Su Visita  Tambin puede enviarnos un mensaje a travs de Clinical cytogeneticist. Por lo general respondemos a los mensajes de MyChart en el transcurso de 1 a 2 das hbiles.  Para renovar recetas, por favor pida a su farmacia que se ponga en contacto con nuestra oficina. Randi lakes de fax es [place] [phone].  Si tiene un asunto urgente cuando la clnica est cerrada y que no puede esperar hasta el siguiente da hbil, puede llamar/localizar a su doctor(a) al nmero que aparece a continuacin.   Por favor, tenga en cuenta que aunque hacemos todo lo posible para estar disponibles para asuntos urgentes fuera del horario de [place], no estamos disponibles las 24 horas del da, los 7 [address]  [address] de la [place].   Si tiene un problema urgente y no puede comunicarse con nosotros, puede optar por buscar atencin mdica  en el consultorio de su doctor(a), en una clnica privada, en un centro de atencin urgente o en una sala de emergencias.  Si tiene Engineer, drilling, por favor llame inmediatamente al 911 o vaya a la sala de emergencias.  Nmeros de bper  - Dr. Hester: [phone]  - Dra. Jackquline: [phone]  - Dr. Claudene: [phone]   En caso de inclemencias del tiempo, por favor llame a landry capes principal al [phone] para una actualizacin sobre el [place] de cualquier retraso o cierre.  Consejos para la medicacin en dermatologa: Por favor, guarde las cajas en las que vienen los  medicamentos de uso tpico para ayudarle a seguir las instrucciones sobre dnde y cmo usarlos. Las farmacias generalmente imprimen las instrucciones del medicamento slo en las cajas y no directamente en los tubos del [place].   Si su medicamento es muy caro, por favor, pngase en contacto con landry rieger llamando al [phone] y presione la opcin 4 o envenos un mensaje a travs de Clinical cytogeneticist.   No podemos decirle cul ser su copago por los medicamentos por adelantado ya que esto es diferente dependiendo de la cobertura de su seguro. Sin embargo, es posible que podamos encontrar un medicamento sustituto a Audiological scientist un formulario para que el seguro cubra el medicamento que se considera necesario.   Si se requiere una autorizacin previa para que su compaa de seguros [place] su medicamento, por favor permtanos de 1 a 2 das hbiles para completar este proceso.  Los precios de los medicamentos varan con frecuencia dependiendo del Environmental consultant de dnde se  surte la receta y alguna farmacias pueden ofrecer precios ms baratos.  El sitio web www.goodrx.com tiene cupones para medicamentos de Health and safety inspector. Los precios aqu no tienen en cuenta lo que podra costar con la ayuda del seguro (puede ser ms barato con su seguro), pero el sitio web puede darle el precio si no utiliz Tourist information centre manager.  - Puede imprimir el cupn correspondiente y llevarlo con su receta a la farmacia.  - Tambin puede pasar por nuestra oficina durante el horario de atencin regular y Education officer, museum una tarjeta de cupones de GoodRx.  - Si necesita que su receta se enve electrnicamente a una farmacia diferente, informe a nuestra oficina a travs de MyChart de [place] o por telfono llamando al [phone] y presione la opcin 4.

## 2024-03-04 NOTE — Progress Notes (Addendum)
 Follow-Up Visit   Subjective  Nichole Sloan is a 78 y.o. female who presents for the following: FFA, scalp, 32m f/u Plaquenil  200mg  1 po qd, Minoxidil  2.5mg  1/2 po qd ( 1 po qd made her light headed), Tacrolimus  0.1% oint at bedtime to hairline, Clobetasol  sol qhs M-F to frontal scalp behind hair line , Ketoconazole  2% shampoo 1x/wk, IL kenalog  injections last visit, no improvement The patient has spots, moles and lesions to be evaluated, some may be new or changing and the patient may have concern these could be cancer.   The following portions of the chart were reviewed this encounter and updated as appropriate: medications, allergies, medical history  Review of Systems:  No other skin or systemic complaints except as noted in HPI or Assessment and Plan.  Objective  Well appearing patient in no apparent distress; mood and affect are within normal limits.   A focused examination was performed of the following areas: scalp  Relevant exam findings are noted in the Assessment and Plan.  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 Condition is symptomatic/ bothersome to patient. Not currently at goal.  Discussed chronic inflammatory condition that causes scarring alopecia.   No cure.     Labs from 03/04/24- CBC wnl, 08/30/23 CMP viewed- wnl Exam:  Scalp Increased thinning of the bil temporal and frontal hairline, no erythema. Scarring alopecia with scattered follicular prominence of the frontal hairline. Thinning of the occipital scalp at hairline L > R. No change from previous photos.  Frontal fibrosing alopecia (FFA) is a chronic/progressive and irreversible patterned form of scarring alopecia that presents as a symmetric band-like zone of hair loss along the anterior hairline. The eyebrows are often thinned or absent. It  is considered a variant of lichen planopilaris in a patterned distribution and is more common in women. Therapies may stop or slow progression but generally do not lead to hair regrowth.    Treatment Plan: Pt defers repeating IL steroid injections today, since didn't seem to help Cont Plaquenil  200mg  1 po every day, Discussed cont yearly eye exams Cont Minoxidil  2.5 1/2 po qd (pt could not tolerate 1 po qd) Pt not able to take finasteride  due to breast cyst Cont Clobetasol  sol hs 5d/wk Monday - Friday to frontal scalp behind hairline, avoid f/g/a Cont Tacrolimus  0.1% oint qhs to frontal/temporal hairline at scalp Cont Ketoconazole  2% shampoo 1x/wk, let sit 5 minutes and rinse out Discussed referral to Dr. Flynn  Hydroxychloroquine  (plaquenil ) may cause sun sensitivity, so daily sun protection is recommended. It can rarely cause irreversible damage to the retina of the eye if used for a long period of time. This damage can be identified and the medication stopped before vision changes are noticed by going for a yearly ophthalmology exam.  Yearly eye exams are recommended if there are underlying risk factors for eye problems (i.e.diabetes), when higher dosage of medication per weight is used (>5mg /kg), and length of time on medication (>5 years). Rarely, this medicine can cause low blood counts, liver inflammation, low blood sugar, and/or a color change of the skin.  It should not be used together with certain medicines that can affect electrical conduction of the heart including macrolide antibiotics such as Azithromycin .  The use of Plaquenil  requires long term medication management, including periodic office  visits and monitoring of blood work.   Doses of oral minoxidil  for hair loss are considered 'low dose'. This is because the doses used for hair loss are much lower than the doses which are used for conditions such as high blood pressure (hypertension). The doses used for hypertension are 10-40mg   per day.  Side effects are uncommon at the low doses (up to 2.5 mg/day) used to treat hair loss. Potential side effects, more commonly seen at higher doses, include: Increase in hair growth (hypertrichosis) elsewhere on face and body Temporary hair shedding upon starting medication which may last up to 4 weeks Ankle swelling, fluid retention, rapid weight gain more than 5 pounds Low blood pressure and feeling lightheaded or dizzy when standing up quickly Fast or irregular heartbeat Headaches  Topical steroids (such as triamcinolone , fluocinolone, fluocinonide, mometasone, clobetasol , halobetasol, betamethasone, hydrocortisone) can cause thinning and lightening of the skin if they are used for too long in the same area. Your physician has selected the right strength medicine for your problem and area affected on the body. Please use your medication only as directed by your physician to prevent side effects.    SEBORRHEIC KERATOSIS Bil dorsum hands Exam: stuck on waxy macules and papules hands, forearms  Treatment Plan: Discussed BBL Discussed cosmetic LN2  Counseling for BBL / IPL / Laser and Coordination of Care Discussed the treatment option of Broad Band Light (BBL) /Intense Pulsed Light (IPL)/ Laser for skin discoloration, including brown spots and redness.  Typically we recommend at least 1-3 treatment sessions about 5-8 weeks apart for best results.  Cannot have tanned skin when BBL performed, and regular use of sunscreen/photoprotection is advised after the procedure to help maintain results. The patient's condition may also require maintenance treatments in 200 per treatment session for the hands.  A fee can be quoted for other parts of the body.  Insurance typically does not pay for BBL/laser treatments and therefore the fee is an out-of-pocket cost. Recommend prophylactic valtrex treatment. Once scheduled for procedure, will send Rx in prior to patient's appointment.   Discussed  cosmetic procedure LN2, noncovered.  $60 for 1st lesion and $15 for each additional lesion if done on the same day.  Maximum charge $350.  One touch-up treatment included no charge. Discussed risks of treatment including dyspigmentation, small scar, and/or recurrence. Recommend daily broad spectrum sunscreen SPF 30+/photoprotection to treated areas once healed.   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   Treatment Plan: Pt has tried 4% Hydroquinone  without improvement, also SM 12% HQ cream Start SM20 Hydroquinone  8% / Tretinoin 0.1% / Kojic Acid 1% / Niacinamide 4% / Fluocinolone 0.025% Cream qhs for up to 3 months to neck Recommend SPF 30+ with Zinc in AM Cont Heliocare prior to prolonged outdoor exposure   FRONTAL FIBROSING ALOPECIA   Related Procedures Ambulatory referral to Dermatology Related Medications tacrolimus  (PROTOPIC ) 0.1 % ointment Apply to affected areas frontal scalp every night as directd. finasteride  (PROSCAR ) 5 MG tablet TAKE 1 TABLET (5 MG TOTAL) BY MOUTH DAILY. minoxidil  (LONITEN ) 2.5 MG tablet Take 1 tablet (2.5 mg total) by mouth daily. clobetasol  (TEMOVATE ) 0.05 % external solution Apply topically to scalp behind hairline every night. Avoid face. hydroxychloroquine  (PLAQUENIL ) 200 MG tablet TAKE 1 TABLET BY MOUTH QD  Return in about 3 months (around 06/04/2024) for FFA, Poikiloderma vs Melasma neck f/u.  I, Sonya Hupman, RMA, am acting as  scribe for Rexene Rattler, MD .   Documentation: I have reviewed the above documentation for accuracy and completeness, and I agree with the above.  Rexene Rattler, MD

## 2024-03-31 ENCOUNTER — Telehealth: Payer: Self-pay

## 2024-03-31 NOTE — Telephone Encounter (Signed)
 Patient called, she has been using Hydroquinone  8% / Tretinoin  0.1% / Kojic Acid 1% / Niacinamide 4% / Fluocinolone 0.025% on her neck for 1 week, she noticed dry skin from this cream on her neck, discussed with patient tretinoin  may cause drying she can try using this cream a thin film every other night until her skin on her neck can tolerate this cream

## 2024-04-17 ENCOUNTER — Other Ambulatory Visit: Payer: Self-pay | Admitting: Dermatology

## 2024-04-17 DIAGNOSIS — L6612 Frontal fibrosing alopecia: Secondary | ICD-10-CM

## 2024-05-01 ENCOUNTER — Telehealth: Payer: Self-pay

## 2024-05-01 NOTE — Telephone Encounter (Signed)
 Called patient and advised I spoke to [facility] about referral placed to Dr. Flynn.  [facility] advised they had made several attempts to contact patient (telephone calls and letter) to get patient scheduled.  Patient said she had not heard anything from them.  Gave patient phone number to [facility] [phone], and advised she could call to make appointment./sh

## 2024-05-16 ENCOUNTER — Other Ambulatory Visit: Payer: Self-pay | Admitting: Family Medicine

## 2024-05-16 DIAGNOSIS — Z1231 Encounter for screening mammogram for malignant neoplasm of breast: Secondary | ICD-10-CM

## 2024-05-27 ENCOUNTER — Ambulatory Visit: Admitting: Dermatology

## 2024-06-17 ENCOUNTER — Ambulatory Visit
Admission: RE | Admit: 2024-06-17 | Discharge: 2024-06-17 | Disposition: A | Discharge status: Home / Self Care | Source: Ambulatory Visit | Attending: Family Medicine | Admitting: Family Medicine

## 2024-06-17 DIAGNOSIS — Z1231 Encounter for screening mammogram for malignant neoplasm of breast: Secondary | ICD-10-CM | POA: Diagnosis present

## 2024-07-02 ENCOUNTER — Ambulatory Visit: Admitting: Dermatology

## 2024-07-02 DIAGNOSIS — L811 Chloasma: Secondary | ICD-10-CM

## 2024-07-02 DIAGNOSIS — L821 Other seborrheic keratosis: Secondary | ICD-10-CM

## 2024-07-02 DIAGNOSIS — L6612 Frontal fibrosing alopecia: Secondary | ICD-10-CM | POA: Diagnosis not present

## 2024-07-02 DIAGNOSIS — L219 Seborrheic dermatitis, unspecified: Secondary | ICD-10-CM | POA: Diagnosis not present

## 2024-07-02 NOTE — Patient Instructions (Addendum)
 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 You will receive an email from them and can purchase the medicine through that link. It will then be mailed to your home at the address you confirmed. If for any reason you do not receive an email from them, please check your spam folder. If you still do not find the email, please let us  know. [facility] phone number is [phone].   Call and reschedule appointment with Dr. Flynn at [facility] [phone]  Due to recent changes in healthcare laws, you may see results of your pathology and/or laboratory studies on MyChart before the doctors have had a chance to review them. We understand that in some cases there may be results that are confusing or concerning to you. Please understand that not all results are received at the same time and often the doctors may need to interpret multiple results in order to provide you with the best plan of care or course of treatment. Therefore, we ask that you please give us  2 business days to thoroughly review all your results before contacting the office for clarification. Should we see a critical lab result, you will be  contacted sooner.   If You Need Anything After Your Visit  If you have any questions or concerns for your doctor, please call our main line at [phone] and press option 4 to reach your doctor's medical assistant. If no one answers, please leave a voicemail as directed and we will return your call as soon as possible. Messages left after 4 pm will be answered the following business day.   You may also send us  a message via MyChart. We typically respond to MyChart messages within 1-2 business days.  For prescription refills, please ask your pharmacy to contact our office. Our fax number is [phone].  If you have an urgent issue when the clinic is closed that cannot wait until the next business day, you can page your doctor at the number below.    Please note that while we do our best to be available for urgent issues outside of office hours, we are not available 24/7.   If you have an urgent issue and are unable to reach us , you may choose to seek medical care at your doctor's office, retail clinic, urgent care center, or emergency room.  If you have a medical emergency, please immediately call 911 or go to the emergency department.  Pager Numbers  - Dr. Hester: [phone]  - Dr. Jackquline: [phone]  - Dr. Claudene: [phone]   - Dr. Raymund: [phone]  In the event of inclement weather, please call our main line at [phone] for an update on the status of any delays or closures.  Dermatology Medication Tips: Please keep the boxes that topical medications come in in order to help keep track of the instructions about where and how to use these. Pharmacies typically print the medication instructions only on the boxes and not directly on the medication tubes.   If your medication is too expensive, please contact our office at [phone] option 4 or send us  a message through MyChart.   We are unable to tell what your co-pay for medications will be in advance as this  is different depending on your insurance coverage. However, we may be able to find a substitute medication at lower cost or fill out paperwork to get insurance to cover a needed medication.   If a prior authorization is required to get your medication covered by your insurance company, please allow us  1-2 business days to complete this process.  Drug prices often vary depending on where the prescription is filled and some pharmacies may offer cheaper prices.  The website www.goodrx.com contains coupons for medications through different pharmacies. The prices here do not account for what the cost may be with help from insurance (it may be cheaper with your insurance), but the website can give you the price if you did not use any insurance.  - You can print the associated coupon and take it with your prescription to the pharmacy.  - You may also stop by our office during regular business hours and pick up a GoodRx coupon card.  - If you need your prescription sent electronically to a different pharmacy, notify our office through [facility] or by phone at [phone] option 4.     Si Usted Necesita Algo Despus de Su Visita  Tambin puede enviarnos un mensaje a travs de Clinical cytogeneticist. Por lo general respondemos a los mensajes de MyChart en el transcurso de 1 a 2 das hbiles.  Para renovar recetas, por favor pida a su farmacia que se ponga en contacto con nuestra oficina. Randi lakes de fax es [place] [phone].  Si tiene un asunto urgente cuando la clnica est cerrada y que no puede esperar hasta el siguiente da hbil, puede llamar/localizar a su doctor(a) al nmero que aparece a continuacin.   Por favor, tenga en cuenta que aunque hacemos todo lo posible para estar disponibles para asuntos urgentes fuera del horario de [place], no estamos disponibles las 24 horas del da, los 7 [address]  [address] de la [place].   Si tiene un problema urgente y no puede comunicarse con nosotros, puede optar por  buscar atencin mdica  en el consultorio de su doctor(a), en una clnica privada, en un centro de atencin urgente o en una sala de emergencias.  Si tiene Engineer, drilling, por favor llame inmediatamente al 911 o vaya a la sala de emergencias.  Nmeros de bper  - Dr. Hester: [phone]  - Dra. Jackquline: [phone]  - Dr. Claudene: [phone]  - Dra. Kitts: [phone]  En caso de inclemencias del [place], por favor llame a nuestra lnea principal al [phone] para una actualizacin sobre el estado de cualquier retraso o cierre.  Consejos para la medicacin en dermatologa: Por favor, guarde las cajas en las que vienen los medicamentos de uso tpico para ayudarle a seguir las instrucciones sobre dnde y cmo usarlos. Las farmacias generalmente imprimen las instrucciones del medicamento slo en las cajas y no directamente en los tubos del [place].   Si su medicamento es muy caro, por favor, pngase en contacto con landry rieger llamando al [phone] y presione la  opcin 4 o envenos un mensaje a travs de MyChart.   No podemos decirle cul ser su copago por los medicamentos por adelantado ya que esto es diferente dependiendo de la cobertura de su seguro. Sin embargo, es posible que podamos encontrar un medicamento sustituto a Audiological scientist un formulario para que el seguro cubra el medicamento que se considera necesario.   Si se requiere una autorizacin previa para que su compaa de seguros malta su medicamento, por favor permtanos de 1 a 2 das hbiles para completar este proceso.  Los precios de los medicamentos varan con frecuencia dependiendo del Environmental consultant de dnde se surte la receta y alguna farmacias pueden ofrecer precios ms baratos.  El sitio web www.goodrx.com tiene cupones para medicamentos de Health and safety inspector. Los precios aqu no tienen en cuenta lo que podra costar con la ayuda del seguro (puede ser ms barato con su seguro), pero el sitio web  puede darle el precio si no utiliz Tourist information centre manager.  - Puede imprimir el cupn correspondiente y llevarlo con su receta a la farmacia.  - Tambin puede pasar por nuestra oficina durante el horario de atencin regular y Education officer, museum una tarjeta de cupones de GoodRx.  - Si necesita que su receta se enve electrnicamente a una farmacia diferente, informe a nuestra oficina a travs de MyChart de [place] o por telfono llamando al [phone] y presione la opcin 4.

## 2024-07-02 NOTE — Progress Notes (Signed)
 Follow-Up Visit   Subjective  Nichole Sloan is a 78 y.o. female who presents for the following: Frontal fibrosing alopecia scalp, 48m f/u, Plaquenil  200mg  1 po qd, Minoxidil  2.5mg  1/2 po qd, Clobetasol  sol hs 5d/wk, Tacrolimus  0.1% oint qhs hairline scalp, Ketoconazole  2% shampoo 1x/wk, pt did get an appointment with Dr. 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  Objective  Well appearing patient in no apparent distress; mood and affect are within normal limits.   A focused examination was performed of the following areas: Scalp, neck, left leg  Relevant exam findings are noted in the Assessment and Plan.    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 Condition is symptomatic/ bothersome to patient. Not currently at goal.  Discussed chronic inflammatory condition that causes scarring alopecia.   No cure.  Labs from 06/19/24 viewed and WNL scalp Exam: mild erythema scalp and scarring alopecia at frontal and temporal hairline scalp with lonely hairs, otherwise stable compared to photos from 03/04/24. No additional hair loss noted   Frontal fibrosing alopecia (FFA) is a chronic/progressive and irreversible patterned form of scarring alopecia that presents as a symmetric band-like zone of hair loss along the anterior hairline. The eyebrows are often thinned or absent. It  is considered a variant of lichen planopilaris in a patterned distribution and is more common in women. Therapies may stop or slow progression but generally do not lead to hair regrowth.   Treatment Plan: Cont Plaquenil  200mg  1 po every day, Discussed cont yearly eye exams.  Pt just had her annual eye exam yesterday and was normal Cont Minoxidil  2.5 1/2 po qd (pt could not tolerate 1 po qd) Pt not able to take finasteride  due to breast cyst Cont Clobetasol  sol hs 5d/wk Monday - Friday to frontal scalp behind hairline, avoid f/g/a Cont Tacrolimus  0.1% oint qhs to frontal/temporal hairline at scalp and in scalp Increase Ketoconazole  2% shampoo to 2-3x/wk, let sit 5 minutes and rinse out Recommend that pt call and reschedule her appointment with Dr. Flynn  Hydroxychloroquine  (plaquenil ) may cause sun sensitivity, so daily sun protection is recommended. It can rarely cause irreversible damage to the retina of the eye if used for a long period of time. This damage can be identified and the medication stopped before vision changes are noticed by going for a yearly ophthalmology exam.  Yearly eye exams are recommended if there are underlying risk factors for eye problems (i.e.diabetes), when higher dosage of medication per weight is used (>5mg /kg), and length of time on medication (>5 years). Rarely, this medicine can cause low blood counts, liver inflammation, low blood sugar, and/or a color change of the skin.  It should not be used together with certain medicines that can affect electrical conduction of the heart including macrolide antibiotics such as Azithromycin .  The use of Plaquenil  requires long term medication management, including periodic office visits and monitoring of  blood work.   Doses of oral minoxidil  for hair loss are considered 'low dose'. This is because the doses used for hair loss are much lower than the doses which are used for conditions such as high blood pressure (hypertension). The  doses used for hypertension are 10-40mg  per day.  Side effects are uncommon at the low doses (up to 2.5 mg/day) used to treat hair loss. Potential side effects, more commonly seen at higher doses, include: Increase in hair growth (hypertrichosis) elsewhere on face and body Temporary hair shedding upon starting medication which may last up to 4 weeks Ankle swelling, fluid retention, rapid weight gain more than 5 pounds Low blood pressure and feeling lightheaded or dizzy when standing up quickly Fast or irregular heartbeat Headaches   Topical steroids (such as triamcinolone , fluocinolone, fluocinonide, mometasone, clobetasol , halobetasol, betamethasone, hydrocortisone) can cause thinning and lightening of the skin if they are used for too long in the same area. Your physician has selected the right strength medicine for your problem and area affected on the body. Please use your medication only as directed by your physician to prevent side effects.   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  Improvement noted when compared to baseline photo.  Chronic and persistent condition with duration or expected duration over one year. Condition is improving with treatment but not currently at goal.  Treatment Plan: In 2 months may restart [facility] Hydroquinone  8% / Tretinoin  0.1% / Kojic Acid 1% / Niacinamide 4% / Fluocinolone 0.025% qhs x 2 months, may cont 2 months on and 2 months off rotation Cont SPF qd  Reviewed risk of permanent dark spots (ochronosis) if hydroquinone  is used longer than 3 months.   Topical steroids (such as triamcinolone , fluocinolone, fluocinonide, mometasone, clobetasol , halobetasol, betamethasone, hydrocortisone) can cause thinning and lightening of the skin if they are used for too long in the same area. Your physician has selected the right strength medicine for your problem and area affected on  the body. Please use your medication only as directed by your physician to prevent side effects.     SEBORRHEIC KERATOSIS Scalp, legs - Stuck-on, waxy, tan-brown papules and/or plaques  - Benign-appearing - Discussed benign etiology and prognosis. - Observe - Call for any changes  SEBORRHEIC DERMATITIS Exam: mild erythema/scale scalp  Chronic and persistent condition with duration or expected duration over one year. Condition is symptomatic / bothersome to patient. Not to goal.  Seborrheic Dermatitis is a chronic persistent rash characterized by pinkness and scaling most commonly of the mid face but also can occur on the scalp (dandruff), ears; mid chest, mid back and groin.  It tends to be exacerbated by stress and cooler weather.  People who have neurologic disease may experience new onset or exacerbation of existing seborrheic dermatitis.  The condition is not curable but treatable and can be controlled.  Treatment Plan: Cont Ketoconazole  2% shampoo increasing to 2-3x/wk let sit 5 minutes and rinse out Clobetasol  solution every day/bid to as scalp prn itch       Return in about 5 months (around 11/30/2024) for FFA and Melasma f/u.  I, Grayce Saunas, RMA, am acting as scribe for Rexene Rattler, MD .   Documentation: I have reviewed the above documentation for accuracy and completeness, and I agree with the above.  Rexene Rattler, MD

## 2024-10-04 ENCOUNTER — Other Ambulatory Visit: Payer: Self-pay | Admitting: Dermatology

## 2024-10-04 DIAGNOSIS — L6612 Frontal fibrosing alopecia: Secondary | ICD-10-CM

## 2024-11-18 ENCOUNTER — Ambulatory Visit: Admitting: Dermatology
